# Patient Record
Sex: Female | Born: 1980
Health system: Southern US, Community
[De-identification: ages and names within clinical notes are randomized; demographics above are authoritative.]

## PROBLEM LIST (undated history)

## (undated) ENCOUNTER — Inpatient Hospital Stay (HOSPITAL_COMMUNITY): Payer: Self-pay

## (undated) DIAGNOSIS — T7840XA Allergy, unspecified, initial encounter: Secondary | ICD-10-CM

## (undated) DIAGNOSIS — Z972 Presence of dental prosthetic device (complete) (partial): Secondary | ICD-10-CM

## (undated) DIAGNOSIS — D649 Anemia, unspecified: Secondary | ICD-10-CM

## (undated) HISTORY — DX: Presence of dental prosthetic device (complete) (partial): Z97.2

## (undated) HISTORY — DX: Allergy, unspecified, initial encounter: T78.40XA

## (undated) HISTORY — PX: TOOTH EXTRACTION: SUR596

## (undated) HISTORY — DX: Anemia, unspecified: D64.9

## (undated) HISTORY — PX: OTHER SURGICAL HISTORY: SHX169

---

## 2004-04-07 ENCOUNTER — Emergency Department (HOSPITAL_COMMUNITY): Admission: EM | Admit: 2004-04-07 | Discharge: 2004-04-07 | Payer: Self-pay | Admitting: Family Medicine

## 2006-08-02 ENCOUNTER — Emergency Department (HOSPITAL_COMMUNITY): Admission: EM | Admit: 2006-08-02 | Discharge: 2006-08-03 | Payer: Self-pay | Admitting: *Deleted

## 2010-12-17 LAB — URINALYSIS, ROUTINE W REFLEX MICROSCOPIC
Bilirubin Urine: NEGATIVE
Hgb urine dipstick: NEGATIVE
Specific Gravity, Urine: 1.026
pH: 6

## 2010-12-17 LAB — COMPREHENSIVE METABOLIC PANEL
ALT: 17
AST: 15
CO2: 26
Calcium: 9.1
Chloride: 101
Creatinine, Ser: 0.66
GFR calc non Af Amer: >60
Glucose, Bld: 92
Sodium: 136
Total Bilirubin: 0.3

## 2010-12-17 LAB — CBC
Hemoglobin: 10.5 — ABNORMAL LOW
MCHC: 31.2
MCV: 70.1 — ABNORMAL LOW
RBC: 4.79
WBC: 11.8 — ABNORMAL HIGH

## 2010-12-17 LAB — DIFFERENTIAL
Basophils Absolute: 0
Basophils Relative: 0
Eosinophils Absolute: 0.1
Eosinophils Relative: 1
Lymphs Abs: 3.5 — ABNORMAL HIGH
Neutrophils Relative %: 65

## 2010-12-17 LAB — GC/CHLAMYDIA PROBE AMP, GENITAL
Chlamydia, DNA Probe: NEGATIVE
GC Probe Amp, Genital: NEGATIVE

## 2010-12-17 LAB — WET PREP, GENITAL: Trich, Wet Prep: NONE SEEN

## 2011-03-02 NOTE — L&D Delivery Note (Signed)
Delivery Note At 6:23 PM a viable female was delivered via Vaginal, Spontaneous Delivery (Presentation: ; Occiput Anterior).  APGAR: 9, 9; weight .   Placenta status: , .  Cord: 3 vessels with the following complications: None.  Cord pH: notb done  Anesthesia: Epidural  Episiotomy: None Lacerations: 2nd degree Suture Repair: 2.0 vicryl Est. Blood Loss (mL):   Mom to postpartum.  Baby to nursery-stable.  Odaliz Mcqueary A 11/09/2011, 6:37 PM

## 2011-06-05 ENCOUNTER — Inpatient Hospital Stay (HOSPITAL_COMMUNITY)
Admission: AD | Admit: 2011-06-05 | Discharge: 2011-06-05 | Disposition: A | Discharge status: Home / Self Care | Payer: Medicaid Other | Source: Ambulatory Visit | Attending: Obstetrics & Gynecology | Admitting: Obstetrics & Gynecology

## 2011-06-05 ENCOUNTER — Encounter (HOSPITAL_COMMUNITY): Payer: Self-pay | Admitting: *Deleted

## 2011-06-05 DIAGNOSIS — R05 Cough: Secondary | ICD-10-CM | POA: Insufficient documentation

## 2011-06-05 DIAGNOSIS — R109 Unspecified abdominal pain: Secondary | ICD-10-CM | POA: Insufficient documentation

## 2011-06-05 DIAGNOSIS — R059 Cough, unspecified: Secondary | ICD-10-CM | POA: Insufficient documentation

## 2011-06-05 DIAGNOSIS — O99891 Other specified diseases and conditions complicating pregnancy: Secondary | ICD-10-CM | POA: Insufficient documentation

## 2011-06-05 DIAGNOSIS — J069 Acute upper respiratory infection, unspecified: Secondary | ICD-10-CM

## 2011-06-05 DIAGNOSIS — N949 Unspecified condition associated with female genital organs and menstrual cycle: Secondary | ICD-10-CM

## 2011-06-05 LAB — URINALYSIS, ROUTINE W REFLEX MICROSCOPIC
Glucose, UA: NEGATIVE mg/dL
Hgb urine dipstick: NEGATIVE
Leukocytes, UA: NEGATIVE
Specific Gravity, Urine: 1.015 (ref 1.005–1.030)
pH: 8.5 — ABNORMAL HIGH (ref 5.0–8.0)

## 2011-06-05 MED ORDER — PROMETHAZINE-DM 6.25-15 MG/5ML PO SYRP: 5.0000 mL | ORAL_SOLUTION | Freq: Four times a day (QID) | ORAL | Status: AC | PRN

## 2011-06-05 MED ORDER — GUAIFENESIN ER 600 MG PO TB12: 1200.0000 mg | ORAL_TABLET | Freq: Two times a day (BID) | ORAL | Status: DC

## 2011-06-05 MED ORDER — LORATADINE 10 MG PO CAPS: 1.0000 | ORAL_CAPSULE | Freq: Every day | ORAL | Status: DC

## 2011-06-05 MED ORDER — AMOXICILLIN 500 MG PO CAPS: 500.0000 mg | ORAL_CAPSULE | Freq: Two times a day (BID) | ORAL | Status: AC

## 2011-06-05 NOTE — MAU Note (Signed)
Pt states, " I've had a cough with green sputum for nearly two weeks and I wheeze at night. I started having pain in my low abdomen last night after coughing severely. "

## 2011-06-05 NOTE — MAU Note (Signed)
Patient is here with c/o lower abdominal pain when you cough. She states that she has been coughing for 2 weeks. Have not started prenatal care because she is waiting on her insurance. Denies any vaginal bleeding or discharge

## 2011-06-05 NOTE — MAU Provider Note (Signed)
Chief Complaint:  Abdominal Pain and Cough    First Provider Initiated Contact with Patient 06/05/11 2300      Kelly Cox is  31 y.o. G1P0.  No LMP recorded. Patient is pregnant.. [redacted]w[redacted]d    She presents complaining of Abdominal Pain and Cough  Pt reports productive cough of yellow/green sputum x 2 weeks. Denies fever, chills, sore throat, HA, or ear pain. States sharp shooting lower abd pain x 2 days with coughing. Has not started [facility] yet, awaiting [facility] card. Plans to go to [facility]. Denies cramping, vaginal bleeding, discharge or UTI s/s  Obstetrical/Gynecological History: OB History    Grav Para Term Preterm Abortions TAB SAB Ect Mult Living   1               Past Medical History: History reviewed. No pertinent past medical history.  Past Surgical History: Past Surgical History  Procedure Date  . Right knee surgery     Family History: History reviewed. No pertinent family history.  Social History: History  Substance Use Topics  . Smoking status: Never Smoker   . Smokeless tobacco: Not on file  . Alcohol Use: No    Allergies: No Known Allergies  Prescriptions prior to admission  Medication Sig Dispense Refill  . Prenatal Vit-Fe Fumarate-FA (PRENATAL MULTIVITAMIN) TABS Take 2 tablets by mouth daily.        Review of Systems - Negative except what has been reviewed in the HPI  Physical Exam   Blood pressure 118/78, pulse 85, temperature 99.1 F (37.3 C), temperature source Oral, height 5' 4.25" (1.632 m), weight 221 lb (100.245 kg).  General: General appearance - alert, well appearing, and in no distress, oriented to person, place, and time and normal appearing weight Mental status - alert, oriented to person, place, and time, normal mood, behavior, speech, dress, motor activity, and thought processes, affect appropriate to mood Eyes - pupils equal and reactive, extraocular eye movements intact Neck - supple, no significant adenopathy Lymphatics - no  palpable lymphadenopathy, no hepatosplenomegaly Chest - clear to auscultation, no wheezes, rales or rhonchi, symmetric air entry Heart - normal rate, regular rhythm, normal S1, S2, no murmurs, rubs, clicks or gallops Abdomen - tenderness noted with palp of round ligament, no abd tenderness with palp Musculoskeletal - no joint tenderness, deformity or swelling Extremities - peripheral pulses normal, no pedal edema, no clubbing or cyanosis Focused Gynecological Exam: examination not indicated  Labs: Recent Results (from the past 24 hour(s))  URINALYSIS, ROUTINE W REFLEX MICROSCOPIC   Collection Time   06/05/11  9:43 PM      Component Value Range   Color, Urine YELLOW  YELLOW    APPearance HAZY (*) CLEAR    Specific Gravity, Urine 1.015  1.005 - 1.030    pH 8.5 (*) 5.0 - 8.0    Glucose, UA NEGATIVE  NEGATIVE (mg/dL)   Hgb urine dipstick NEGATIVE  NEGATIVE    Bilirubin Urine NEGATIVE  NEGATIVE    Ketones, ur NEGATIVE  NEGATIVE (mg/dL)   Protein, ur NEGATIVE  NEGATIVE (mg/dL)   Urobilinogen, UA 0.2  0.0 - 1.0 (mg/dL)   Nitrite NEGATIVE  NEGATIVE    Leukocytes, UA NEGATIVE  NEGATIVE     Assessment: 1. URI (upper respiratory infection)   2. Round ligament pain     Plan: Discharge home Rx ABX, mucinex, and cough med sent to [facility]. 06/05/2011,11:25 PM

## 2011-06-05 NOTE — Discharge Instructions (Signed)
Round Ligament Pain The round ligament is made up of muscle and fibrous tissue. It is attached to the uterus near the fallopian tube. The round ligament is located on both sides of the uterus and helps support the position of the uterus. It usually begins in the second trimester of pregnancy when the uterus comes out of the pelvis. The pain can come and go until the baby is delivered. Round ligament pain is not a serious problem and does not cause harm to the baby. CAUSE During pregnancy the uterus grows the most from the second trimester to delivery. As it grows, it stretches and slightly twists the round ligaments. When the uterus leans from one side to the other, the round ligament on the opposite side pulls and stretches. This can cause pain. SYMPTOMS  Pain can occur on one side or both sides. The pain is usually a short, sharp, and pinching-like. Sometimes it can be a dull, lingering and aching pain. The pain is located in the lower side of the abdomen or in the groin. The pain is internal and usually starts deep in the groin and moves up to the outside of the hip area. Pain can occur with:  Sudden change in position like getting out of bed or a chair.   Rolling over in bed.   Coughing or sneezing.   Walking too much.   Any type of physical activity.  DIAGNOSIS  Your caregiver will make sure there are no serious problems causing the pain. When nothing serious is found, the symptoms usually indicate that the pain is from the round ligament. TREATMENT   Sit down and relax when the pain starts.   Flex your knees up to your belly.   Lay on your side with a pillow under your belly (abdomen) and another one between your legs.   Sit in a hot bath for 15 to 20 minutes or until the pain goes away.  HOME CARE INSTRUCTIONS   Only take over-the-counter or prescriptions medicines for pain, discomfort or fever as directed by your caregiver.   Sit and stand slowly.   Avoid long walks if it  causes pain.   Stop or lessen your physical activities if it causes pain.  SEEK MEDICAL CARE IF:   The pain does not go away with any of your treatment.   You need stronger medication for the pain.   You develop back pain that you did not have before with the side pain.  SEEK IMMEDIATE MEDICAL CARE IF:   You develop a temperature of 102 F (38.9 C) or higher.   You develop uterine contractions.   You develop vaginal bleeding.   You develop nausea, vomiting or diarrhea.   You develop chills.   You have pain when you urinate.  Document Released: 11/25/2007 Document Revised: 02/04/2011 Document Reviewed: 11/25/2007 [facility] Patient Information 2012 [place], [place].Upper Respiratory Infection, Adult An upper respiratory infection (URI) is also known as the common cold. It is often caused by a type of germ (virus). Colds are easily spread (contagious). You can pass it to others by kissing, coughing, sneezing, or drinking out of the same glass. Usually, you get better in 1 or 2 weeks.  HOME CARE   Only take medicine as told by your doctor.   Use a warm mist humidifier or breathe in steam from a hot shower.   Drink enough water and fluids to keep your pee (urine) clear or pale yellow.   Get plenty of rest.  Return to work when your temperature is back to normal or as told by your doctor. You may use a face mask and wash your hands to stop your cold from spreading.  GET HELP RIGHT AWAY IF:   After the first few days, you feel you are getting worse.   You have questions about your medicine.   You have chills, shortness of breath, or brown or red spit (mucus).   You have yellow or brown snot (nasal discharge) or pain in the face, especially when you bend forward.   You have a fever, puffy (swollen) neck, pain when you swallow, or white spots in the back of your throat.   You have a bad headache, ear pain, sinus pain, or chest pain.   You have a high-pitched whistling sound  when you breathe in and out (wheezing).   You have a lasting cough or cough up blood.   You have sore muscles or a stiff neck.  MAKE SURE YOU:   Understand these instructions.   Will watch your condition.   Will get help right away if you are not doing well or get worse.  Document Released: 08/04/2007 Document Revised: 02/04/2011 Document Reviewed: 06/22/2010 [facility] Patient Information 2012 [place], [place].

## 2011-06-06 NOTE — MAU Provider Note (Signed)
Attestation of Attending Supervision of Advanced Practitioner: Evaluation and management procedures were performed by the [facility] Fellow/PA/CNM/NP under my supervision and collaboration. Chart reviewed, and agree with management and plan.  Jaynie Collins, M.D. 06/06/2011 8:24 AM

## 2011-07-02 ENCOUNTER — Other Ambulatory Visit: Payer: Self-pay | Admitting: Obstetrics & Gynecology

## 2011-07-02 ENCOUNTER — Other Ambulatory Visit: Payer: Self-pay

## 2011-07-02 DIAGNOSIS — Z3689 Encounter for other specified antenatal screening: Secondary | ICD-10-CM

## 2011-07-02 LAB — OB RESULTS CONSOLE HIV ANTIBODY (ROUTINE TESTING): HIV: NONREACTIVE

## 2011-07-02 LAB — OB RESULTS CONSOLE RPR: RPR: NONREACTIVE

## 2011-07-02 LAB — OB RESULTS CONSOLE GC/CHLAMYDIA: Chlamydia: NEGATIVE

## 2011-07-05 ENCOUNTER — Ambulatory Visit (HOSPITAL_COMMUNITY)
Admission: RE | Admit: 2011-07-05 | Discharge: 2011-07-05 | Disposition: A | Discharge status: Home / Self Care | Payer: Medicaid Other | Source: Ambulatory Visit | Attending: Obstetrics & Gynecology | Admitting: Obstetrics & Gynecology

## 2011-07-05 DIAGNOSIS — Z363 Encounter for antenatal screening for malformations: Secondary | ICD-10-CM | POA: Insufficient documentation

## 2011-07-05 DIAGNOSIS — Z1389 Encounter for screening for other disorder: Secondary | ICD-10-CM | POA: Insufficient documentation

## 2011-07-05 DIAGNOSIS — Z3689 Encounter for other specified antenatal screening: Secondary | ICD-10-CM

## 2011-07-05 DIAGNOSIS — O3410 Maternal care for benign tumor of corpus uteri, unspecified trimester: Secondary | ICD-10-CM | POA: Insufficient documentation

## 2011-07-05 DIAGNOSIS — O358XX0 Maternal care for other (suspected) fetal abnormality and damage, not applicable or unspecified: Secondary | ICD-10-CM | POA: Insufficient documentation

## 2011-07-08 ENCOUNTER — Other Ambulatory Visit: Payer: Self-pay | Admitting: Obstetrics & Gynecology

## 2011-07-30 ENCOUNTER — Other Ambulatory Visit: Payer: Self-pay | Admitting: Nurse Practitioner

## 2011-07-30 DIAGNOSIS — Z1389 Encounter for screening for other disorder: Secondary | ICD-10-CM

## 2011-08-04 ENCOUNTER — Encounter (HOSPITAL_COMMUNITY): Payer: Self-pay

## 2011-08-04 ENCOUNTER — Other Ambulatory Visit: Payer: Self-pay | Admitting: Nurse Practitioner

## 2011-08-04 ENCOUNTER — Ambulatory Visit (HOSPITAL_COMMUNITY)
Admission: RE | Admit: 2011-08-04 | Discharge: 2011-08-04 | Disposition: A | Discharge status: Home / Self Care | Payer: Medicaid Other | Source: Ambulatory Visit | Attending: Nurse Practitioner | Admitting: Nurse Practitioner

## 2011-08-04 VITALS — BP 107/69 | HR 89 | Wt 223.0 lb

## 2011-08-04 DIAGNOSIS — O3500X0 Maternal care for (suspected) central nervous system malformation or damage in fetus, unspecified, not applicable or unspecified: Secondary | ICD-10-CM | POA: Insufficient documentation

## 2011-08-04 DIAGNOSIS — Z1389 Encounter for screening for other disorder: Secondary | ICD-10-CM

## 2011-08-04 DIAGNOSIS — O3410 Maternal care for benign tumor of corpus uteri, unspecified trimester: Secondary | ICD-10-CM | POA: Insufficient documentation

## 2011-08-04 DIAGNOSIS — O350XX0 Maternal care for (suspected) central nervous system malformation in fetus, not applicable or unspecified: Secondary | ICD-10-CM | POA: Insufficient documentation

## 2011-08-04 NOTE — Progress Notes (Signed)
Patient seen today  for follow up ultrasound.  See full report in [facility]OB/[facility]N.  Alpha Gula, MD  Patient is seen due to unilateral ventriclomegaly.  The left lateral ventricle measures 1.04 to 1.1 cm (upper limits of normal 1cm).  No cranial calcifications noted.  The remainder of the anatomy is within normal limits.  Single IUP at 26 1/7 weeks Mild/ unilateral ventriculomegaly is noted (left side) Otherwise normal anatomic fetal survey Fetal growth is appropriate (63rd percentile) Normal amniotic fluid volume  Recommend follow up ultrasound in 4 weeks to reevaluate.

## 2011-08-05 NOTE — Progress Notes (Signed)
Genetic Counseling  High-Risk Gestation Note  Appointment Date:  08/04/2011 Referred By: Laverta Baltimore, NP Date of Birth:  1980/11/27    Pregnancy History: G1P0 Estimated Date of Delivery: 11/09/11 Estimated Gestational Age: [redacted]w[redacted]d Attending: Alpha Gula, MD  Ms. Kelly Cox was seen for genetic counseling given the previous ultrasound finding of ventriculomegaly.   Ultrasound today visualized [redacted]w[redacted]d gestation with mild, unilateral ventriculomegaly visualized. The left lateral ventricle was reported to measure from 1.04 cm to 1.1 cm. Remaining visualized fetal anatomy appeared normal. Complete ultrasound results reported separately.   We discussed that ventriculomegaly refers to an increased measurement of the lateral ventricles in the brain greater than 10 mm. Possible causes for mild ventriculomegaly include overproduction of cerebrospinal fluid, a blockage in a duct causing the fluid to accumulate, an intrauterine infection, or a variation of normal. We discussed that once ventriculomegaly is identified in pregnancy, follow-up ultrasounds are offered to assess for progression of ventriculomegaly. Following delivery, post-natal studies may be required in order to track progress and assess for any other differences in brain anatomy. It was discussed that surgical intervention may be required in some cases of ventriculomegaly. Most cases of isolated mild ventriculomegaly do not have an identified cause or associated condition, tend to regress with time, and have no resulting health or learning problems. However, it is possible that this finding may be seen in association with other ultrasound differences or a genetic condition. There is also a slightly increased chance of developmental delays. We discussed that the presence of ventriculomegaly is associated with an increased risk for aneuploidy, specifically Down syndrome.   We reviewed chromosomes, nondisjunction, and the features and prognosis  of Down syndrome. They were counseled regarding maternal age and the association with risk for chromosome conditions due to nondisjunction with aging of ova. We reviewed that Ms. Kelly Cox previously had Quad screening, which reduced the risk for fetal Down syndrome from her age-related risk to 1 in 16,900. The ultrasound finding of mild ventriculomegaly would increase the risk for Down syndrome from the screening risk to approximately 1 in 1,877, which is still less than the patient's a priori age related risk and less than the risk of complications associated with amniocentesis. She was counseled regarding their options of ultrasound and amniocentesis. It was discussed that not all birth defects can be identified with these procedures.  A risk of 1 in 200-300 was given for amniocentesis, the primary complication being spontaneous pregnancy loss or preterm delivery. We reviewed another available screening option, noninvasive prenatal testing (NIPT).  Specifically, we discussed that NIPT analyzes cell free fetal DNA found in the maternal circulation. This test is not diagnostic for chromosome conditions, but can provide information regarding the presence or absence of extra fetal DNA for chromosomes 13, 18, 21, X, and Y, and missing fetal DNA for chromosome X and Y (Turner syndrome). Thus, it would not identify or rule out all genetic conditions. The reported detection rate is greater than 99% for Trisomy 21, greater than 98% for Trisomy 18, and is approximately 80% (8 out of 10) for Trisomy 13. The false positive rate is reported to be less than 0.1% for any of these conditions. She indicated that she understood these risks and decided to have ultrasound only and declined amniocentesis and cell free fetal DNA testing. A follow-up ultrasound was planned in 4 weeks.  Ms. Kelly Cox was provided with written information regarding sickle cell anemia (SCA) including the carrier frequency and incidence in the  African-American  population, the availability of carrier testing and prenatal diagnosis if indicated.  In addition, we discussed that hemoglobinopathies are routinely screened for as part of the [place] newborn screening panel.  OB medical records indicate that the patient had a negative screen for sickle cell trait. Hemoglobin electrophoresis is available to screen for additional hemoglobin variants, if desired, and if not previously performed.   Both family histories were reviewed and found to be contributory for the patient's maternal half-sister born with a hole in the heart that closed on its own. She is otherwise healthy. Congenital heart defects occur in approximately 1% of pregnancies.  Congenital heart defects may occur due to multifactorial influences, chromosomal abnormalities, genetic syndromes or environmental exposures.  Isolated heart defects are generally multifactorial.  Given the reported family history and assuming multifactorial inheritance, the risk for a congenital heart defect in the current pregnancy (a third degree relative to the affected individual) does not appear to be increased above the general population risk. Without further information regarding the provided family history, an accurate genetic risk cannot be calculated. Further genetic counseling is warranted if more information is obtained.   Ms. Feeser denied exposure to environmental toxins or chemical agents. She denied the use of alcohol, tobacco or street drugs. She denied significant viral illnesses during the course of her pregnancy. Her medical and surgical histories were noncontributory.    I counseled Ms. Kelly Cox regarding the above risks and available options.  The approximate face-to-face time with the genetic counselor was 25 minutes.   Quinn Plowman, MS Certified Genetic Counselor  08/05/2011

## 2011-08-17 ENCOUNTER — Inpatient Hospital Stay (HOSPITAL_COMMUNITY)
Admission: AD | Admit: 2011-08-17 | Discharge: 2011-08-17 | Disposition: A | Discharge status: Home / Self Care | Payer: Medicaid Other | Source: Ambulatory Visit | Attending: Obstetrics & Gynecology | Admitting: Obstetrics & Gynecology

## 2011-08-17 DIAGNOSIS — Z2989 Encounter for other specified prophylactic measures: Secondary | ICD-10-CM | POA: Insufficient documentation

## 2011-08-17 DIAGNOSIS — Z298 Encounter for other specified prophylactic measures: Secondary | ICD-10-CM | POA: Insufficient documentation

## 2011-08-17 DIAGNOSIS — Z3480 Encounter for supervision of other normal pregnancy, unspecified trimester: Secondary | ICD-10-CM | POA: Insufficient documentation

## 2011-08-17 MED ORDER — RHO D IMMUNE GLOBULIN 1500 UNIT/2ML IJ SOLN
300.0000 ug | Freq: Once | INTRAMUSCULAR | Status: DC
  Administered 2011-08-17: 300 ug via INTRAMUSCULAR
  Filled 2011-08-17: qty 2

## 2011-08-17 NOTE — MAU Note (Signed)
Rhophylac information sheet given to the patient while waiting in the lobby. Explained the process of 1 1/2 hours required to complete this order. Patient verbalizes understanding of instructions.

## 2011-08-18 LAB — RH IG WORKUP (INCLUDES ABO/RH)
ABO/RH(D): A NEG
Fetal Screen: NEGATIVE
Gestational Age(Wks): 28

## 2011-09-01 ENCOUNTER — Ambulatory Visit (HOSPITAL_COMMUNITY)
Admission: RE | Admit: 2011-09-01 | Discharge: 2011-09-01 | Disposition: A | Discharge status: Home / Self Care | Payer: Medicaid Other | Source: Ambulatory Visit | Attending: Nurse Practitioner | Admitting: Nurse Practitioner

## 2011-09-01 VITALS — BP 108/66 | HR 92 | Wt 227.8 lb

## 2011-09-01 DIAGNOSIS — O350XX0 Maternal care for (suspected) central nervous system malformation in fetus, not applicable or unspecified: Secondary | ICD-10-CM | POA: Insufficient documentation

## 2011-09-01 DIAGNOSIS — Z1389 Encounter for screening for other disorder: Secondary | ICD-10-CM

## 2011-09-01 DIAGNOSIS — O358XX0 Maternal care for other (suspected) fetal abnormality and damage, not applicable or unspecified: Secondary | ICD-10-CM

## 2011-09-01 DIAGNOSIS — O3500X0 Maternal care for (suspected) central nervous system malformation or damage in fetus, unspecified, not applicable or unspecified: Secondary | ICD-10-CM | POA: Insufficient documentation

## 2011-09-01 DIAGNOSIS — O3410 Maternal care for benign tumor of corpus uteri, unspecified trimester: Secondary | ICD-10-CM | POA: Insufficient documentation

## 2011-09-01 NOTE — Progress Notes (Signed)
Patient seen today  for follow up ultrasound.  See full report in [facility]N.  Alpha Gula, MD  The patient returns for follow up due to isolated mild ventriculomegaly.  The lateral ventricles measure approximately 9mm (within normal limits)- improved since last visit  Single IUP at 30 1/7 weeks Mild ventriculomegaly appears to have resolved Otherwise normal anatomic fetal survey Fetal growth is appropriate (49th percentile) Normal amniotic fluid volume  Recommend follow up ultrasound in 4 weeks to reevaluate.  Would recommend cranial imaging studies of the newborn after delivery.

## 2011-09-29 ENCOUNTER — Ambulatory Visit (HOSPITAL_COMMUNITY)
Admission: RE | Admit: 2011-09-29 | Discharge: 2011-09-29 | Disposition: A | Discharge status: Home / Self Care | Payer: Medicaid Other | Source: Ambulatory Visit | Attending: Nurse Practitioner | Admitting: Nurse Practitioner

## 2011-09-29 VITALS — BP 112/74 | HR 100 | Wt 230.2 lb

## 2011-09-29 DIAGNOSIS — O3410 Maternal care for benign tumor of corpus uteri, unspecified trimester: Secondary | ICD-10-CM | POA: Insufficient documentation

## 2011-09-29 DIAGNOSIS — O3500X0 Maternal care for (suspected) central nervous system malformation or damage in fetus, unspecified, not applicable or unspecified: Secondary | ICD-10-CM | POA: Insufficient documentation

## 2011-09-29 DIAGNOSIS — O358XX0 Maternal care for other (suspected) fetal abnormality and damage, not applicable or unspecified: Secondary | ICD-10-CM

## 2011-09-29 DIAGNOSIS — O350XX0 Maternal care for (suspected) central nervous system malformation in fetus, not applicable or unspecified: Secondary | ICD-10-CM | POA: Insufficient documentation

## 2011-09-29 NOTE — Progress Notes (Signed)
Patient seen today  for follow up ultrasound.  See full report in [facility]OB/GYN.  Alpha Gula, MD  Single IUP at 34 1/7 weeks Mild/ borderline unilateral ventriculomegaly noted (12 mm) The cranial anatomy otherwise appears normal Interval growth is appropriate (40th %tile) Normal amniotic fluid volume   Recommend follow up ultrasounds as clinically indicated.  Recommend cranial imaging studies of the newborn after delivery.

## 2011-10-07 LAB — OB RESULTS CONSOLE GBS: GBS: NEGATIVE

## 2011-11-08 ENCOUNTER — Encounter (HOSPITAL_COMMUNITY): Payer: Self-pay | Admitting: *Deleted

## 2011-11-08 ENCOUNTER — Inpatient Hospital Stay (HOSPITAL_COMMUNITY)
Admission: AD | Admit: 2011-11-08 | Discharge: 2011-11-11 | DRG: 775 | Disposition: A | Discharge status: Home / Self Care | Payer: Medicaid Other | Source: Ambulatory Visit | Attending: Obstetrics | Admitting: Obstetrics

## 2011-11-08 LAB — CBC
HCT: 34 % — ABNORMAL LOW (ref 36.0–46.0)
Hemoglobin: 11 g/dL — ABNORMAL LOW (ref 12.0–15.0)
MCH: 24.1 pg — ABNORMAL LOW (ref 26.0–34.0)
MCHC: 32.4 g/dL (ref 30.0–36.0)
MCV: 74.4 fL — ABNORMAL LOW (ref 78.0–100.0)
Platelets: 272 10*3/uL (ref 150–400)
RBC: 4.57 MIL/uL (ref 3.87–5.11)
RDW: 15.6 % — ABNORMAL HIGH (ref 11.5–15.5)
WBC: 8.4 10*3/uL (ref 4.0–10.5)

## 2011-11-08 MED ORDER — FLEET ENEMA 7-19 GM/118ML RE ENEM: 1.0000 | ENEMA | RECTAL | Status: DC | PRN

## 2011-11-08 MED ORDER — LACTATED RINGERS IV SOLN: 500.0000 mL | INTRAVENOUS | Status: DC | PRN

## 2011-11-08 MED ORDER — ONDANSETRON HCL 4 MG/2ML IJ SOLN: 4.0000 mg | Freq: Four times a day (QID) | INTRAMUSCULAR | Status: DC | PRN

## 2011-11-08 MED ORDER — BUTORPHANOL TARTRATE 1 MG/ML IJ SOLN
1.0000 mg | INTRAMUSCULAR | Status: DC | PRN
Start: 2011-11-08 — End: 2011-11-09
  Administered 2011-11-09: 1 mg via INTRAVENOUS
  Filled 2011-11-08: qty 1

## 2011-11-08 MED ORDER — CITRIC ACID-SODIUM CITRATE 334-500 MG/5ML PO SOLN: 30.0000 mL | ORAL | Status: DC | PRN

## 2011-11-08 MED ORDER — OXYTOCIN 40 UNITS IN LACTATED RINGERS INFUSION - SIMPLE MED: 62.5000 mL/h | Freq: Once | INTRAVENOUS | Status: DC

## 2011-11-08 MED ORDER — OXYTOCIN 40 UNITS IN LACTATED RINGERS INFUSION - SIMPLE MED
1.0000 m[IU]/min | INTRAVENOUS | Status: DC
Start: 2011-11-08 — End: 2011-11-09
  Administered 2011-11-09: 1 m[IU]/min via INTRAVENOUS
  Filled 2011-11-08: qty 1000

## 2011-11-08 MED ORDER — OXYTOCIN BOLUS FROM INFUSION
500.0000 mL | Freq: Once | INTRAVENOUS | Status: AC
  Administered 2011-11-09: 500 mL via INTRAVENOUS
  Filled 2011-11-08: qty 500

## 2011-11-08 MED ORDER — ACETAMINOPHEN 325 MG PO TABS: 650.0000 mg | ORAL_TABLET | ORAL | Status: DC | PRN

## 2011-11-08 MED ORDER — TERBUTALINE SULFATE 1 MG/ML IJ SOLN: 0.2500 mg | Freq: Once | INTRAMUSCULAR | Status: AC | PRN

## 2011-11-08 MED ORDER — IBUPROFEN 600 MG PO TABS
600.0000 mg | ORAL_TABLET | Freq: Four times a day (QID) | ORAL | Status: DC | PRN
  Filled 2011-11-08: qty 1

## 2011-11-08 MED ORDER — LIDOCAINE HCL (PF) 1 % IJ SOLN: 30.0000 mL | INTRAMUSCULAR | Status: DC | PRN

## 2011-11-08 MED ORDER — LACTATED RINGERS IV SOLN
INTRAVENOUS | Status: DC
  Administered 2011-11-08 – 2011-11-09 (×4): via INTRAVENOUS

## 2011-11-08 MED ORDER — OXYCODONE-ACETAMINOPHEN 5-325 MG PO TABS: 1.0000 | ORAL_TABLET | ORAL | Status: DC | PRN

## 2011-11-08 NOTE — MAU Note (Signed)
Leaking clear fluid (~1700); now little pink spotting noted .  More fluid since . No pain.  No problems with preg.

## 2011-11-09 ENCOUNTER — Inpatient Hospital Stay (HOSPITAL_COMMUNITY): Payer: Medicaid Other | Admitting: Anesthesiology

## 2011-11-09 ENCOUNTER — Encounter (HOSPITAL_COMMUNITY): Payer: Self-pay | Admitting: Anesthesiology

## 2011-11-09 ENCOUNTER — Encounter (HOSPITAL_COMMUNITY): Payer: Self-pay | Admitting: *Deleted

## 2011-11-09 LAB — TYPE AND SCREEN

## 2011-11-09 MED ORDER — PHENYLEPHRINE 40 MCG/ML (10ML) SYRINGE FOR IV PUSH (FOR BLOOD PRESSURE SUPPORT)
80.0000 ug | PREFILLED_SYRINGE | INTRAVENOUS | Status: DC | PRN
  Filled 2011-11-09: qty 5

## 2011-11-09 MED ORDER — DIPHENHYDRAMINE HCL 50 MG/ML IJ SOLN: 12.5000 mg | INTRAMUSCULAR | Status: DC | PRN

## 2011-11-09 MED ORDER — LANOLIN HYDROUS EX OINT: TOPICAL_OINTMENT | CUTANEOUS | Status: DC | PRN

## 2011-11-09 MED ORDER — ZOLPIDEM TARTRATE 5 MG PO TABS: 5.0000 mg | ORAL_TABLET | Freq: Every evening | ORAL | Status: DC | PRN

## 2011-11-09 MED ORDER — SIMETHICONE 80 MG PO CHEW: 80.0000 mg | CHEWABLE_TABLET | ORAL | Status: DC | PRN

## 2011-11-09 MED ORDER — OXYCODONE-ACETAMINOPHEN 5-325 MG PO TABS: 1.0000 | ORAL_TABLET | ORAL | Status: DC | PRN

## 2011-11-09 MED ORDER — DIBUCAINE 1 % RE OINT: 1.0000 "application " | TOPICAL_OINTMENT | RECTAL | Status: DC | PRN

## 2011-11-09 MED ORDER — EPHEDRINE 5 MG/ML INJ: 10.0000 mg | INTRAVENOUS | Status: DC | PRN

## 2011-11-09 MED ORDER — TETANUS-DIPHTH-ACELL PERTUSSIS 5-2.5-18.5 LF-MCG/0.5 IM SUSP
0.5000 mL | Freq: Once | INTRAMUSCULAR | Status: AC
  Administered 2011-11-10: 0.5 mL via INTRAMUSCULAR
  Filled 2011-11-09: qty 0.5

## 2011-11-09 MED ORDER — SENNOSIDES-DOCUSATE SODIUM 8.6-50 MG PO TABS
2.0000 | ORAL_TABLET | Freq: Every day | ORAL | Status: DC
  Administered 2011-11-09 – 2011-11-10 (×2): 2 via ORAL

## 2011-11-09 MED ORDER — FERROUS SULFATE 325 (65 FE) MG PO TABS
325.0000 mg | ORAL_TABLET | Freq: Two times a day (BID) | ORAL | Status: DC
  Administered 2011-11-10 – 2011-11-11 (×3): 325 mg via ORAL
  Filled 2011-11-09 (×3): qty 1

## 2011-11-09 MED ORDER — FENTANYL 2.5 MCG/ML BUPIVACAINE 1/10 % EPIDURAL INFUSION (WH - ANES)
INTRAMUSCULAR | Status: DC | PRN
  Administered 2011-11-09: 14 mL/h via EPIDURAL

## 2011-11-09 MED ORDER — PHENYLEPHRINE 40 MCG/ML (10ML) SYRINGE FOR IV PUSH (FOR BLOOD PRESSURE SUPPORT): 80.0000 ug | PREFILLED_SYRINGE | INTRAVENOUS | Status: DC | PRN

## 2011-11-09 MED ORDER — FENTANYL 2.5 MCG/ML BUPIVACAINE 1/10 % EPIDURAL INFUSION (WH - ANES)
14.0000 mL/h | INTRAMUSCULAR | Status: DC
  Administered 2011-11-09 (×2): 14 mL/h via EPIDURAL
  Filled 2011-11-09 (×3): qty 60

## 2011-11-09 MED ORDER — EPHEDRINE 5 MG/ML INJ
10.0000 mg | INTRAVENOUS | Status: DC | PRN
  Filled 2011-11-09: qty 4

## 2011-11-09 MED ORDER — BENZOCAINE-MENTHOL 20-0.5 % EX AERO
1.0000 "application " | INHALATION_SPRAY | CUTANEOUS | Status: DC | PRN
  Administered 2011-11-11: 1 via TOPICAL
  Filled 2011-11-09: qty 56

## 2011-11-09 MED ORDER — ONDANSETRON HCL 4 MG PO TABS: 4.0000 mg | ORAL_TABLET | ORAL | Status: DC | PRN

## 2011-11-09 MED ORDER — LACTATED RINGERS IV SOLN
500.0000 mL | Freq: Once | INTRAVENOUS | Status: AC
  Administered 2011-11-09: 07:00:00 via INTRAVENOUS

## 2011-11-09 MED ORDER — IBUPROFEN 600 MG PO TABS
600.0000 mg | ORAL_TABLET | Freq: Four times a day (QID) | ORAL | Status: DC
  Administered 2011-11-09 – 2011-11-11 (×7): 600 mg via ORAL
  Filled 2011-11-09 (×6): qty 1

## 2011-11-09 MED ORDER — LIDOCAINE HCL (PF) 1 % IJ SOLN
INTRAMUSCULAR | Status: DC | PRN
  Administered 2011-11-09 (×2): 4 mL

## 2011-11-09 MED ORDER — WITCH HAZEL-GLYCERIN EX PADS: 1.0000 "application " | MEDICATED_PAD | CUTANEOUS | Status: DC | PRN

## 2011-11-09 MED ORDER — PRENATAL MULTIVITAMIN CH
1.0000 | ORAL_TABLET | Freq: Every day | ORAL | Status: DC
  Administered 2011-11-10 – 2011-11-11 (×2): 1 via ORAL
  Filled 2011-11-09 (×2): qty 1

## 2011-11-09 MED ORDER — ONDANSETRON HCL 4 MG/2ML IJ SOLN: 4.0000 mg | INTRAMUSCULAR | Status: DC | PRN

## 2011-11-09 MED ORDER — DIPHENHYDRAMINE HCL 25 MG PO CAPS: 25.0000 mg | ORAL_CAPSULE | Freq: Four times a day (QID) | ORAL | Status: DC | PRN

## 2011-11-09 NOTE — Progress Notes (Signed)
Dr. Gaynell Face called to check patient status.  Informed of SVE, IV pain med, FHR, and patient desire vitals.  No new orders received at this time.

## 2011-11-09 NOTE — Progress Notes (Signed)
Kelly Cox is a 31 y.o. G1P0000 at [redacted]w[redacted]d by LMP admitted for rupture of membranes  Subjective:   Objective: BP 133/94  Pulse 82  Temp 98.5 F (36.9 C) (Oral)  Resp 20  Ht 5\' 6"  (1.676 m)  Wt 107.049 kg (236 lb)  BMI 38.09 kg/m2  SpO2 99%  LMP 02/15/2011      FHT:  FHR: 150 bpm, variability: moderate,  accelerations:  Present,  decelerations:  Absent UC:   regular, every 3 minutes SVE:   Dilation: 6 Effacement (%): 90 Station: +1 Exam by:: k fields, rn  Labs: Lab Results  Component Value Date   WBC 8.4 11/08/2011   HGB 11.0* 11/08/2011   HCT 34.0* 11/08/2011   MCV 74.4* 11/08/2011   PLT 272 11/08/2011    Assessment / Plan: Augmentation of labor, progressing well  Labor: Progressing normally Preeclampsia:  n/a Fetal Wellbeing:  Category I Pain Control:  Epidural I/D:  n/a Anticipated MOD:  NSVD  Christine Morton A 11/09/2011, 3:28 PM

## 2011-11-09 NOTE — Anesthesia Preprocedure Evaluation (Signed)
Anesthesia Evaluation  Patient identified by MRN, date of birth, ID band Patient awake    Reviewed: Allergy & Precautions, H&P , Patient's Chart, lab work & pertinent test results  Airway Mallampati: III TM Distance: >3 FB Neck ROM: full    Dental No notable dental hx. (+) Teeth Intact   Pulmonary neg pulmonary ROS,  breath sounds clear to auscultation  Pulmonary exam normal       Cardiovascular negative cardio ROS  Rhythm:regular Rate:Normal     Neuro/Psych negative neurological ROS  negative psych ROS   GI/Hepatic negative GI ROS, Neg liver ROS,   Endo/Other  Morbid obesity  Renal/GU negative Renal ROS  negative genitourinary   Musculoskeletal   Abdominal Normal abdominal exam  (+)   Peds  Hematology negative hematology ROS (+)   Anesthesia Other Findings   Reproductive/Obstetrics (+) Pregnancy                           Anesthesia Physical Anesthesia Plan  ASA: III  Anesthesia Plan: Epidural   Post-op Pain Management:    Induction:   Airway Management Planned:   Additional Equipment:   Intra-op Plan:   Post-operative Plan:   Informed Consent: I have reviewed the patients History and Physical, chart, labs and discussed the procedure including the risks, benefits and alternatives for the proposed anesthesia with the patient or authorized representative who has indicated his/her understanding and acceptance.     Plan Discussed with: Anesthesiologist, CRNA and Surgeon  Anesthesia Plan Comments:         Anesthesia Quick Evaluation

## 2011-11-09 NOTE — H&P (Signed)
This is Dr. Francoise Ceo dictating the history and physical on  Kelly Cox she is a 31 year old gravida 1 at 74 weeks her EDC is 11/09/2011 she was admitted because her membranes ruptured spontaneously at 4:27 PM yesterday fluid clear and she is now contracting irregularly she is on low-dose Pitocin and she is a negative GBS cervix 2 cm 60% vertex -2 station Past medical history negative Past surgical history negative Social history noncontributory Family history negative System review noncontributory Physical exam revealed a well-developed female in and in labor HEENT negative Lungs clear to P&A Heart regular rhythm no murmurs no gallops breasts negative Abdomen term estimated fetal weight 7 pounds Pelvic as described above

## 2011-11-09 NOTE — Anesthesia Procedure Notes (Signed)
Epidural Patient location during procedure: OB Start time: 11/09/2011 8:00 AM  Staffing Anesthesiologist: Kenith Trickel A. Performed by: anesthesiologist   Preanesthetic Checklist Completed: patient identified, site marked, surgical consent, pre-op evaluation, timeout performed, IV checked, risks and benefits discussed and monitors and equipment checked  Epidural Patient position: sitting Prep: site prepped and draped and DuraPrep Patient monitoring: continuous pulse ox and blood pressure Approach: midline Injection technique: LOR air  Needle:  Needle type: Tuohy  Needle gauge: 17 G Needle length: 9 cm and 9 Needle insertion depth: 8 cm Catheter type: closed end flexible Catheter size: 19 Gauge Catheter at skin depth: 13 cm Test dose: negative and Other  Assessment Events: blood not aspirated, injection not painful, no injection resistance, negative IV test and no paresthesia  Additional Notes Patient identified. Risks and benefits discussed including failed block, incomplete  Pain control, post dural puncture headache, nerve damage, paralysis, blood pressure Changes, nausea, vomiting, reactions to medications-both toxic and allergic and post Partum back pain. All questions were answered. Patient expressed understanding and wished to proceed. Sterile technique was used throughout procedure. Epidural site was Dressed with sterile barrier dressing. No paresthesias, signs of intravascular injection Or signs of intrathecal spread were encountered.  Patient was more comfortable after the epidural was dosed. Please see RN's note for documentation of vital signs and FHR which are stable.

## 2011-11-10 LAB — CBC
MCV: 74 fL — ABNORMAL LOW (ref 78.0–100.0)
Platelets: 247 10*3/uL (ref 150–400)
RDW: 15.8 % — ABNORMAL HIGH (ref 11.5–15.5)
WBC: 12.2 10*3/uL — ABNORMAL HIGH (ref 4.0–10.5)

## 2011-11-10 MED ORDER — RHO D IMMUNE GLOBULIN 1500 UNIT/2ML IJ SOLN
300.0000 ug | Freq: Once | INTRAMUSCULAR | Status: AC
  Administered 2011-11-10: 300 ug via INTRAMUSCULAR
  Filled 2011-11-10: qty 2

## 2011-11-10 NOTE — Progress Notes (Signed)
Ur chart review completed.  

## 2011-11-10 NOTE — Progress Notes (Signed)
Post Partum Day 1 Subjective: no complaints  Objective: Blood pressure 107/69, pulse 80, temperature 98.2 F (36.8 C), temperature source Oral, resp. rate 18, height 5\' 6"  (1.676 m), weight 107.049 kg (236 lb), last menstrual period 02/15/2011, SpO2 96.00%, unknown if currently breastfeeding.  Physical Exam:  General: alert and no distress Lochia: appropriate Uterine Fundus: firm Incision: healing well DVT Evaluation: No evidence of DVT seen on physical exam.   Basename 11/10/11 0550 11/08/11 2014  HGB 9.2* 11.0*  HCT 28.8* 34.0*    Assessment/Plan: Plan for discharge tomorrow   LOS: 2 days   Sadao Weyer A 11/10/2011, 12:43 PM

## 2011-11-10 NOTE — Anesthesia Postprocedure Evaluation (Signed)
  Anesthesia Post-op Note  Patient: Kelly Cox  Procedure(s) Performed: * No procedures listed *  Patient Location: Mother/Baby  Anesthesia Type: Epidural  Level of Consciousness: awake, alert  and oriented  Airway and Oxygen Therapy: Patient Spontanous Breathing  Post-op Pain: none  Post-op Assessment: Post-op Vital signs reviewed, Patient's Cardiovascular Status Stable, No headache and No backache, residual numbness in right thigh which patient reports is resolving.  Post-op Vital Signs: Reviewed and stable  Complications: No apparent anesthesia complications

## 2011-11-11 LAB — RH IG WORKUP (INCLUDES ABO/RH)
Fetal Screen: NEGATIVE
Gestational Age(Wks): 40

## 2011-11-11 MED ORDER — IBUPROFEN 600 MG PO TABS: 600.0000 mg | ORAL_TABLET | Freq: Four times a day (QID) | ORAL | Status: DC | PRN

## 2011-11-11 MED ORDER — OXYCODONE-ACETAMINOPHEN 5-325 MG PO TABS: 1.0000 | ORAL_TABLET | ORAL | Status: AC | PRN

## 2011-11-11 MED ORDER — PRENATAL MULTIVITAMIN CH: 1.0000 | ORAL_TABLET | Freq: Every day | ORAL | Status: DC

## 2011-11-11 NOTE — Progress Notes (Signed)
Post Partum Day 2 Subjective: no complaints  Objective: Blood pressure 105/69, pulse 84, temperature 98.1 F (36.7 C), temperature source Oral, resp. rate 16, height 5\' 6"  (1.676 m), weight 107.049 kg (236 lb), last menstrual period 02/15/2011, SpO2 97.00%, unknown if currently breastfeeding.  Physical Exam:  General: alert and no distress Lochia: appropriate Uterine Fundus: firm Incision: healing well DVT Evaluation: No evidence of DVT seen on physical exam.   Basename 11/10/11 0550 11/08/11 2014  HGB 9.2* 11.0*  HCT 28.8* 34.0*    Assessment/Plan: Discharge home   LOS: 3 days   Elika Godar A 11/11/2011, 8:48 AM

## 2011-11-11 NOTE — Discharge Summary (Signed)
Obstetric Discharge Summary Reason for Admission: onset of labor Prenatal Procedures: ultrasound Intrapartum Procedures: spontaneous vaginal delivery Postpartum Procedures: none Complications-Operative and Postpartum: none Hemoglobin  Date Value Range Status  11/10/2011 9.2* 12.0 - 15.0 g/dL Final     HCT  Date Value Range Status  11/10/2011 28.8* 36.0 - 46.0 % Final    Physical Exam:  General: alert and no distress Lochia: appropriate Uterine Fundus: firm Incision: healing well DVT Evaluation: No evidence of DVT seen on physical exam.  Discharge Diagnoses: Term Pregnancy-delivered  Discharge Information: Date: 11/11/2011 Activity: pelvic rest Diet: routine Medications: PNV, Ibuprofen, Colace and Percocet Condition: stable Instructions: refer to practice specific booklet Discharge to: home Follow-up Information    Follow up with HARPER,CHARLES A, MD. Schedule an appointment as soon as possible for a visit in 6 weeks.   Contact information:   [address] [place] [address] [phone]          Newborn Data: Live born female  Birth Weight: 6 lb 13.9 oz (3116 g) APGAR: 9, 9  Home with mother.  HARPER,CHARLES A 11/11/2011, 8:57 AM

## 2013-10-16 ENCOUNTER — Ambulatory Visit (INDEPENDENT_AMBULATORY_CARE_PROVIDER_SITE_OTHER): Payer: BC Managed Care – PPO | Admitting: Medical

## 2013-10-16 ENCOUNTER — Encounter: Payer: Self-pay | Admitting: Medical

## 2013-10-16 VITALS — BP 118/78 | HR 80 | Temp 98.3°F | Resp 16 | Ht 64.0 in | Wt 252.0 lb

## 2013-10-16 DIAGNOSIS — R0982 Postnasal drip: Secondary | ICD-10-CM

## 2013-10-16 DIAGNOSIS — E669 Obesity, unspecified: Secondary | ICD-10-CM

## 2013-10-16 DIAGNOSIS — Z23 Encounter for immunization: Secondary | ICD-10-CM

## 2013-10-16 DIAGNOSIS — R202 Paresthesia of skin: Secondary | ICD-10-CM

## 2013-10-16 DIAGNOSIS — J329 Chronic sinusitis, unspecified: Secondary | ICD-10-CM

## 2013-10-16 DIAGNOSIS — Z0000 Encounter for general adult medical examination without abnormal findings: Secondary | ICD-10-CM

## 2013-10-16 DIAGNOSIS — R209 Unspecified disturbances of skin sensation: Secondary | ICD-10-CM

## 2013-10-16 LAB — POCT URINALYSIS DIPSTICK
Bilirubin, UA: NEGATIVE
Glucose, UA: NEGATIVE
KETONES UA: NEGATIVE
LEUKOCYTES UA: NEGATIVE
Nitrite, UA: NEGATIVE
PH UA: 5
PROTEIN UA: NEGATIVE
Spec Grav, UA: 1.02
Urobilinogen, UA: NEGATIVE

## 2013-10-16 MED ORDER — BENZONATATE 200 MG PO CAPS: 200.0000 mg | ORAL_CAPSULE | Freq: Three times a day (TID) | ORAL | Status: DC | PRN

## 2013-10-16 NOTE — Patient Instructions (Signed)
Thank you for giving me the opportunity to serve you today.    Your diagnosis today includes: Encounter Diagnoses  Name Primary?  . Routine general medical examination at a health care facility Yes  . Need for prophylactic vaccination and inoculation against influenza   . Post-nasal drainage   . Paresthesia   . Obesity, unspecified      Specific recommendations today include:  See a dentist yearly for routine preventative care  Exercise regularly, eat healthy, work on losing some weight  Begin nighttime Benadryl or Zyrtec for postnasal drainage  You may use Tessalon Perles for cough up to 3 times day  We updated your flu shot today  Begin nighttime carpal tunnel splints over-the-counter for carpal tunnel symptoms  You may use ibuprofen for carpal tunnel symptoms as well  Return fasting for labs within a week at today's visit  Return pending f/u for labs.    I have included other useful information below for your review.   Preventative Care for Adults - Female      MAINTAIN REGULAR HEALTH EXAMS:  A routine yearly physical is a good way to check in with your primary care provider about your health and preventive screening. It is also an opportunity to share updates about your health and any concerns you have, and receive a thorough all-over exam.   Most health insurance companies pay for at least some preventative services.  Check with your health plan for specific coverages.  WHAT PREVENTATIVE SERVICES DO WOMEN NEED?  Adult women should have their weight and blood pressure checked regularly.   Women age 39 and older should have their cholesterol levels checked regularly.  Women should be screened for cervical cancer with a Pap smear and pelvic exam beginning at either age 54, or 3 years after they become sexually activity.    Breast cancer screening generally begins at age 59 with a mammogram and breast exam by your primary care provider.    Beginning at age  51 and continuing to age 57, women should be screened for colorectal cancer.  Certain people may need continued testing until age 25.  Updating vaccinations is part of preventative care.  Vaccinations help protect against diseases such as the flu.  Osteoporosis is a disease in which the bones lose minerals and strength as we age. Women ages 43 and over should discuss this with their caregivers, as should women after menopause who have other risk factors.  Lab tests are generally done as part of preventative care to screen for anemia and blood disorders, to screen for problems with the kidneys and liver, to screen for bladder problems, to check blood sugar, and to check your cholesterol level.  Preventative services generally include counseling about diet, exercise, avoiding tobacco, drugs, excessive alcohol consumption, and sexually transmitted infections.    GENERAL RECOMMENDATIONS FOR GOOD HEALTH:  Healthy diet:  Eat a variety of foods, including fruit, vegetables, animal or vegetable protein, such as meat, fish, chicken, and eggs, or beans, lentils, tofu, and grains, such as rice.  Drink plenty of water daily.  Decrease saturated fat in the diet, avoid lots of red meat, processed foods, sweets, fast foods, and fried foods.  Exercise:  Aerobic exercise helps maintain good heart health. At least 30-40 minutes of moderate-intensity exercise is recommended. For example, a brisk walk that increases your heart rate and breathing. This should be done on most days of the week.   Find a type of exercise or a variety of exercises  that you enjoy so that it becomes a part of your daily life.  Examples are running, walking, swimming, water aerobics, and biking.  For motivation and support, explore group exercise such as aerobic class, spin class, Zumba, Yoga,or  martial arts, etc.    Set exercise goals for yourself, such as a certain weight goal, walk or run in a race such as a 5k walk/run.  Speak to  your primary care provider about exercise goals.  Disease prevention:  If you smoke or chew tobacco, find out from your caregiver how to quit. It can literally save your life, no matter how long you have been a tobacco user. If you do not use tobacco, never begin.   Maintain a healthy diet and normal weight. Increased weight leads to problems with blood pressure and diabetes.   The Body Mass Index or BMI is a way of measuring how much of your body is fat. Having a BMI above 27 increases the risk of heart disease, diabetes, hypertension, stroke and other problems related to obesity. Your caregiver can help determine your BMI and based on it develop an exercise and dietary program to help you achieve or maintain this important measurement at a healthful level.  High blood pressure causes heart and blood vessel problems.  Persistent high blood pressure should be treated with medicine if weight loss and exercise do not work.   Fat and cholesterol leaves deposits in your arteries that can block them. This causes heart disease and vessel disease elsewhere in your body.  If your cholesterol is found to be high, or if you have heart disease or certain other medical conditions, then you may need to have your cholesterol monitored frequently and be treated with medication.   Ask if you should have a cardiac stress test if your history suggests this. A stress test is a test done on a treadmill that looks for heart disease. This test can find disease prior to there being a problem.  Menopause can be associated with physical symptoms and risks. Hormone replacement therapy is available to decrease these. You should talk to your caregiver about whether starting or continuing to take hormones is right for you.   Osteoporosis is a disease in which the bones lose minerals and strength as we age. This can result in serious bone fractures. Risk of osteoporosis can be identified using a bone density scan. Women ages 77  and over should discuss this with their caregivers, as should women after menopause who have other risk factors. Ask your caregiver whether you should be taking a calcium supplement and Vitamin D, to reduce the rate of osteoporosis.   Avoid drinking alcohol in excess (more than two drinks per day).  Avoid use of street drugs. Do not share needles with anyone. Ask for professional help if you need assistance or instructions on stopping the use of alcohol, cigarettes, and/or drugs.  Brush your teeth twice a day with fluoride toothpaste, and floss once a day. Good oral hygiene prevents tooth decay and gum disease. The problems can be painful, unattractive, and can cause other health problems. Visit your dentist for a routine oral and dental check up and preventive care every 6-12 months.   Look at your skin regularly.  Use a mirror to look at your back. Notify your caregivers of changes in moles, especially if there are changes in shapes, colors, a size larger than a pencil eraser, an irregular border, or development of new moles.  Safety:  Use  seatbelts 100% of the time, whether driving or as a passenger.  Use safety devices such as hearing protection if you work in environments with loud noise or significant background noise.  Use safety glasses when doing any work that could send debris in to the eyes.  Use a helmet if you ride a bike or motorcycle.  Use appropriate safety gear for contact sports.  Talk to your caregiver about gun safety.  Use sunscreen with a SPF (or skin protection factor) of 15 or greater.  Lighter skinned people are at a greater risk of skin cancer. Don't forget to also wear sunglasses in order to protect your eyes from too much damaging sunlight. Damaging sunlight can accelerate cataract formation.   Practice safe sex. Use condoms. Condoms are used for birth control and to help reduce the spread of sexually transmitted infections (or STIs).  Some of the STIs are gonorrhea (the  clap), chlamydia, syphilis, trichomonas, herpes, HPV (human papilloma virus) and HIV (human immunodeficiency virus) which causes AIDS. The herpes, HIV and HPV are viral illnesses that have no cure. These can result in disability, cancer and death.   Keep carbon monoxide and smoke detectors in your home functioning at all times. Change the batteries every 6 months or use a model that plugs into the wall.   Vaccinations:  Stay up to date with your tetanus shots and other required immunizations. You should have a booster for tetanus every 10 years. Be sure to get your flu shot every year, since 5%-20% of the [place] population comes down with the flu. The flu vaccine changes each year, so being vaccinated once is not enough. Get your shot in the fall, before the flu season peaks.   Other vaccines to consider:  Human Papilloma Virus or HPV causes cancer of the cervix, and other infections that can be transmitted from person to person. There is a vaccine for HPV, and females should get immunized between the ages of 43 and 5. It requires a series of 3 shots.   Pneumococcal vaccine to protect against certain types of pneumonia.  This is normally recommended for adults age 41 or older.  However, adults younger than 35 years old with certain underlying conditions such as diabetes, heart or lung disease should also receive the vaccine.  Shingles vaccine to protect against Varicella Zoster if you are older than age 38, or younger than 35 years old with certain underlying illness.  Hepatitis A vaccine to protect against a form of infection of the liver by a virus acquired from food.  Hepatitis B vaccine to protect against a form of infection of the liver by a virus acquired from blood or body fluids, particularly if you work in health care.  If you plan to travel internationally, check with your local health department for specific vaccination recommendations.  Cancer Screening:  Breast cancer screening is  essential to preventive care for women. All women age 60 and older should perform a breast self-exam every month. At age 67 and older, women should have their caregiver complete a breast exam each year. Women at ages 31 and older should have a mammogram (x-ray film) of the breasts. Your caregiver can discuss how often you need mammograms.    Cervical cancer screening includes taking a Pap smear (sample of cells examined under a microscope) from the cervix (end of the uterus). It also includes testing for HPV (Human Papilloma Virus, which can cause cervical cancer). Screening and a pelvic exam should begin at  age 38, or 3 years after a woman becomes sexually active. Screening should occur every year, with a Pap smear but no HPV testing, up to age 59. After age 35, you should have a Pap smear every 3 years with HPV testing, if no HPV was found previously.   Most routine colon cancer screening begins at the age of 80. On a yearly basis, doctors may provide special easy to use take-home tests to check for hidden blood in the stool. Sigmoidoscopy or colonoscopy can detect the earliest forms of colon cancer and is life saving. These tests use a small camera at the end of a tube to directly examine the colon. Speak to your caregiver about this at age 14, when routine screening begins (and is repeated every 5 years unless early forms of pre-cancerous polyps or small growths are found).

## 2013-10-16 NOTE — Progress Notes (Signed)
Subjective:   HPI  Kelly Cox is a 33 y.o. female who presents for a complete physical.  New patient today.   Preventative care:2013 Last ophthalmology visit: 2 years ago [address] Last dental visit: 1 year ago, Dr Tye Savoy Last colonoscopy:no Last mammogram:no Last gynecological exam:2013 Last EKG:no Last labs:no  Prior vaccinations: TD or Tdap: unsure Influenza:no Pneumococcal:no Shingles/Zostavax:no  Advanced directive:no Health care power of attorney:no Living will:no  Concerns: She has some skin lesion she wants me to look at.  Donated blood in her teenage years, ever since then has been a scar of her left antecubital region. Has 2 other bumps recently popped up on her arm and foot  Not sexually active.  No current sexual partner.  Last STD testing normal, last pap (s) x 3 normal, last one 2013.  periods regular.  Reviewed their medical, surgical, family, social, medication, and allergy history and updated chart as appropriate.   Review of Systems Constitutional: -fever, -chills, -sweats, -unexpected weight change, -decreased appetite, +fatigue Allergy: -sneezing, -itching, -congestion Dermatology: +changing moles, --rash, -lumps ENT: -runny nose, -ear pain, -sore throat, -hoarseness, -sinus pain, -teeth pain, - ringing in ears, -hearing loss, -nosebleeds Cardiology: +chest pain, -palpitations, -swelling, -difficulty breathing when lying flat, -waking up short of breath Respiratory: -cough, -shortness of breath, -difficulty breathing with exercise or exertion, -wheezing, -coughing up blood Gastroenterology: -abdominal pain, -nausea, -vomiting, -diarrhea, -constipation, -blood in stool, -changes in bowel movement, -difficulty swallowing or eating Hematology: -bleeding, -bruising  Musculoskeletal: -joint aches, -muscle aches, -joint swelling, +back pain, -neck pain, -cramping, -changes in gait Ophthalmology: denies vision changes, eye redness, itching,  discharge Urology: -burning with urination, -difficulty urinating, -blood in urine, -urinary frequency, -urgency, -incontinence Neurology: -headache, -weakness +tingling, -numbness, -memory loss, -falls, -dizziness Psychology: -depressed mood, -agitation, -sleep problems  Past Medical History  Diagnosis Date  . Allergy   . Presence of partial dental prosthetic device     Past Surgical History  Procedure Laterality Date  . Right knee surgery      arthoscopic surgery for meniscal tear, Dr. Marlou Sa  . Tooth extraction      History   Social History  . Marital Status: Single    Spouse Name: N/A    Number of Children: N/A  . Years of Education: N/A   Occupational History  . Not on file.   Social History Main Topics  . Smoking status: Never Smoker   . Smokeless tobacco: Never Used  . Alcohol Use: No  . Drug Use: No  . Sexual Activity: Not Currently   Other Topics Concern  . Not on file   Social History Narrative   Single, has 2yo son, works at [employer] in [employer], exercise with walking, involved in [employer].    Family History  Problem Relation Age of Onset  . Diabetes Mother   . Other Father     unknown  . Anxiety disorder Sister   . Diabetes Sister   . Hypertension Sister   . Cancer Maternal Aunt     breast  . Cancer Maternal Grandmother     pancreatic  . Heart disease Neg Hx   . Stroke Neg Hx     Current outpatient prescriptions:ibuprofen (ADVIL,MOTRIN) 600 MG tablet, Take 1 tablet (600 mg total) by mouth every 6 (six) hours as needed (pain scale < 4)., Disp: 30 tablet, Rfl: 5;  Prenatal Vit-Fe Fumarate-FA (PRENATAL MULTIVITAMIN) TABS, Take 1 tablet by mouth daily., Disp: 30 tablet, Rfl: 11  No Known Allergies  Objective:   Physical Exam  BP 118/78  Pulse 80  Temp(Src) 98.3 F (36.8 C) (Oral)  Resp 16  Ht 5\' 4"  (1.626 m)  Wt 252 lb (114.306 kg)  BMI 43.23 kg/m2  LMP 10/15/2013   General appearance: alert, no distress,  WD/WN, obese AA female Skin: right antecubital region with linear 2.5cm brown raised scar c/w keloid, small 2 mm maculopapular lesion of right forearm, similar lesion on dorsal left foot, both benign appearing, no other worrisome lesions HEENT: normocephalic, conjunctiva/corneas normal, sclerae anicteric, PERRLA, EOMi, nares patent, no discharge or erythema, pharynx normal Oral cavity: MMM, tongue normal, teeth in good repair Neck: supple, no lymphadenopathy, no thyromegaly, no masses, normal ROM, no bruits Chest: non tender, normal shape and expansion Heart: RRR, normal S1, S2, no murmurs Lungs: CTA bilaterally, no wheezes, rhonchi, or rales Abdomen: +bs, soft, non tender, non distended, no masses, no hepatomegaly, no splenomegaly, no bruits Back: non tender, normal ROM, no scoliosis Musculoskeletal: upper extremities non tender, no obvious deformity, normal ROM throughout, lower extremities non tender, no obvious deformity, normal ROM throughout Extremities: no edema, no cyanosis, no clubbing Pulses: 2+ symmetric, upper and lower extremities, normal cap refill Neurological: alert, oriented x 3, CN2-12 intact, strength normal upper extremities and lower extremities, sensation normal throughout, DTRs 2+ throughout, no cerebellar signs, gait normal Psychiatric: normal affect, behavior normal, pleasant  Breast/gyn/rectal - declined   Assessment and Plan :    Encounter Diagnoses  Name Primary?  . Routine general medical examination at a health care facility Yes  . Need for prophylactic vaccination and inoculation against influenza   . Post-nasal drainage   . Paresthesia   . Obesity, unspecified     Physical exam - discussed healthy lifestyle, diet, exercise, preventative care, vaccinations, and addressed their concerns.  Handout given.  Counseled on the influenza virus vaccine.  Vaccine information sheet given.  Influenza vaccine given after consent obtained.  Pap due 2016.  Declines  breast/pelvic exam.  May consider going back to gyn.  Specific recommendations today include:  See a dentist yearly for routine preventative care  Exercise regularly, eat healthy, work on losing some weight  Begin nighttime Benadryl or Zyrtec for postnasal drainage  You may use Tessalon Perles for cough up to 3 times day  We updated your flu shot today  Begin nighttime carpal tunnel splints over-the-counter for carpal tunnel symptoms  You may use ibuprofen for carpal tunnel symptoms as well  Return fasting for labs within a week at today's visit  Follow-up pending lab f/u

## 2013-10-19 ENCOUNTER — Other Ambulatory Visit: Payer: BC Managed Care – PPO

## 2013-10-19 DIAGNOSIS — R0982 Postnasal drip: Secondary | ICD-10-CM

## 2013-10-19 DIAGNOSIS — R202 Paresthesia of skin: Secondary | ICD-10-CM

## 2013-10-19 DIAGNOSIS — Z23 Encounter for immunization: Secondary | ICD-10-CM

## 2013-10-19 DIAGNOSIS — E669 Obesity, unspecified: Secondary | ICD-10-CM

## 2013-10-19 DIAGNOSIS — Z0000 Encounter for general adult medical examination without abnormal findings: Secondary | ICD-10-CM

## 2013-10-19 LAB — LIPID PANEL
CHOL/HDL RATIO: 5 ratio
Cholesterol: 174 mg/dL (ref 0–200)
HDL: 35 mg/dL — AB (ref >39)
LDL CALC: 127 mg/dL — AB (ref 0–99)
TRIGLYCERIDES: 62 mg/dL (ref <150)
VLDL: 12 mg/dL (ref 0–40)

## 2013-10-19 LAB — COMPREHENSIVE METABOLIC PANEL
ALK PHOS: 93 U/L (ref 39–117)
ALT: 12 U/L (ref 0–35)
AST: 11 U/L (ref 0–37)
Albumin: 3.7 g/dL (ref 3.5–5.2)
BILIRUBIN TOTAL: 0.2 mg/dL (ref 0.2–1.2)
BUN: 13 mg/dL (ref 6–23)
CO2: 26 mEq/L (ref 19–32)
CREATININE: 0.62 mg/dL (ref 0.50–1.10)
Calcium: 8.8 mg/dL (ref 8.4–10.5)
Chloride: 106 mEq/L (ref 96–112)
GLUCOSE: 97 mg/dL (ref 70–99)
POTASSIUM: 4.4 meq/L (ref 3.5–5.3)
Sodium: 138 mEq/L (ref 135–145)
Total Protein: 6.8 g/dL (ref 6.0–8.3)

## 2013-10-19 LAB — CBC WITH DIFFERENTIAL/PLATELET
BASOS ABS: 0 10*3/uL (ref 0.0–0.1)
BASOS PCT: 0 % (ref 0–1)
EOS ABS: 0.1 10*3/uL (ref 0.0–0.7)
EOS PCT: 1 % (ref 0–5)
HEMATOCRIT: 32.6 % — AB (ref 36.0–46.0)
Hemoglobin: 10.4 g/dL — ABNORMAL LOW (ref 12.0–15.0)
Lymphocytes Relative: 48 % — ABNORMAL HIGH (ref 12–46)
Lymphs Abs: 3.6 10*3/uL (ref 0.7–4.0)
MCH: 21.8 pg — AB (ref 26.0–34.0)
MCHC: 31.9 g/dL (ref 30.0–36.0)
MCV: 68.3 fL — ABNORMAL LOW (ref 78.0–100.0)
MONO ABS: 0.5 10*3/uL (ref 0.1–1.0)
Monocytes Relative: 6 % (ref 3–12)
NEUTROS ABS: 3.4 10*3/uL (ref 1.7–7.7)
Neutrophils Relative %: 45 % (ref 43–77)
Platelets: 327 10*3/uL (ref 150–400)
RBC: 4.77 MIL/uL (ref 3.87–5.11)
RDW: 17.9 % — AB (ref 11.5–15.5)
WBC: 7.5 10*3/uL (ref 4.0–10.5)

## 2013-10-20 LAB — HEMOGLOBIN A1C
Hgb A1c MFr Bld: 6 % — ABNORMAL HIGH (ref <5.7)
Mean Plasma Glucose: 126 mg/dL — ABNORMAL HIGH (ref <117)

## 2013-10-20 LAB — TSH: TSH: 1.035 u[IU]/mL (ref 0.350–4.500)

## 2013-10-24 ENCOUNTER — Other Ambulatory Visit: Payer: Self-pay | Admitting: Family Medicine

## 2013-10-24 DIAGNOSIS — Z01419 Encounter for gynecological examination (general) (routine) without abnormal findings: Secondary | ICD-10-CM

## 2013-10-25 ENCOUNTER — Telehealth: Payer: Self-pay | Admitting: Obstetrics & Gynecology

## 2013-10-25 NOTE — Telephone Encounter (Signed)
LMTCB to schedule a new patient doctor referral.

## 2013-12-05 ENCOUNTER — Encounter: Payer: BC Managed Care – PPO | Admitting: Nurse Practitioner

## 2013-12-16 IMAGING — US US OB DETAIL+14 WK
1 series · 12 of 28 positions shown · non-contrast
Comparison: none

[Series 1: us ob detail +14 wk · 12 of 90 slices shown]
[im 4/90]
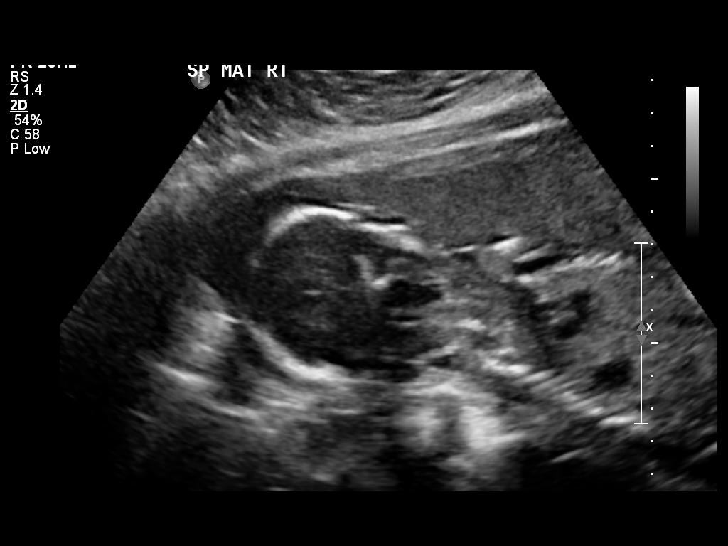
[im 10/90]
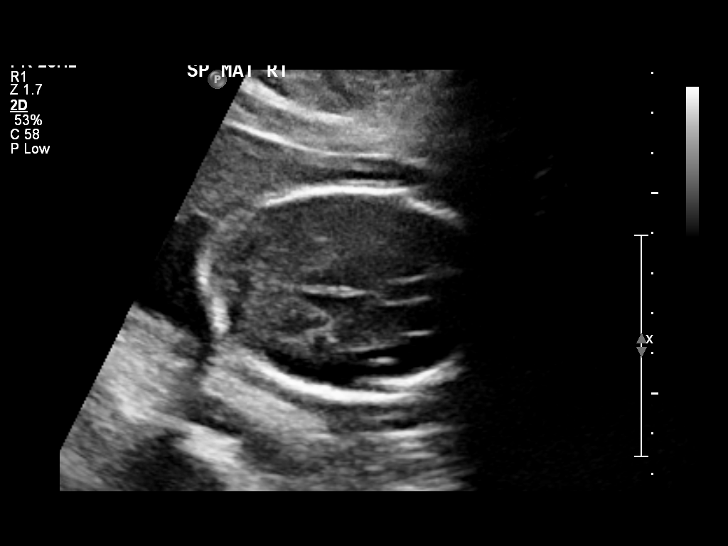
[im 17/90]
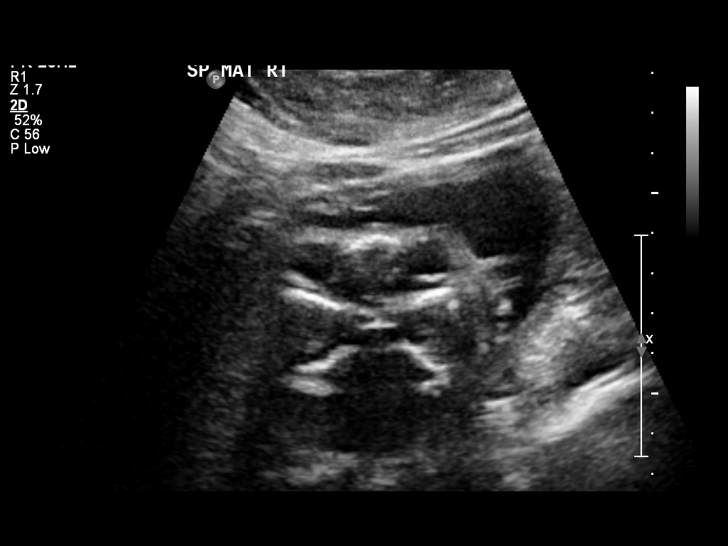
[im 27/90]
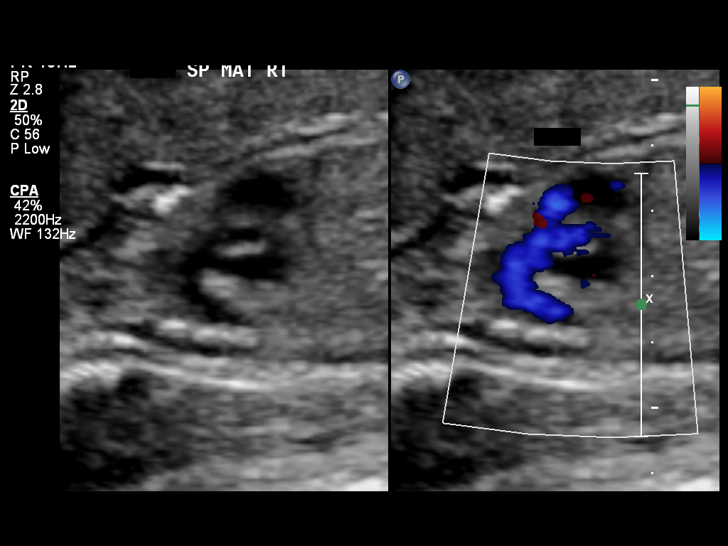
[im 33/90]
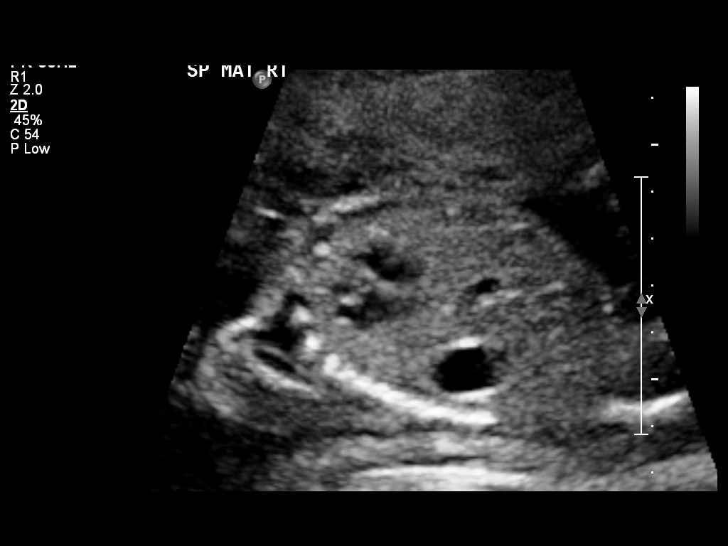
[im 40/90]
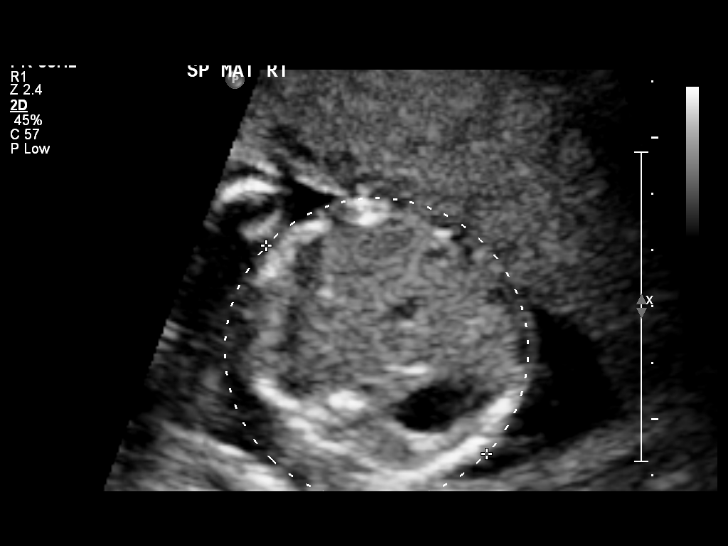
[im 50/90]
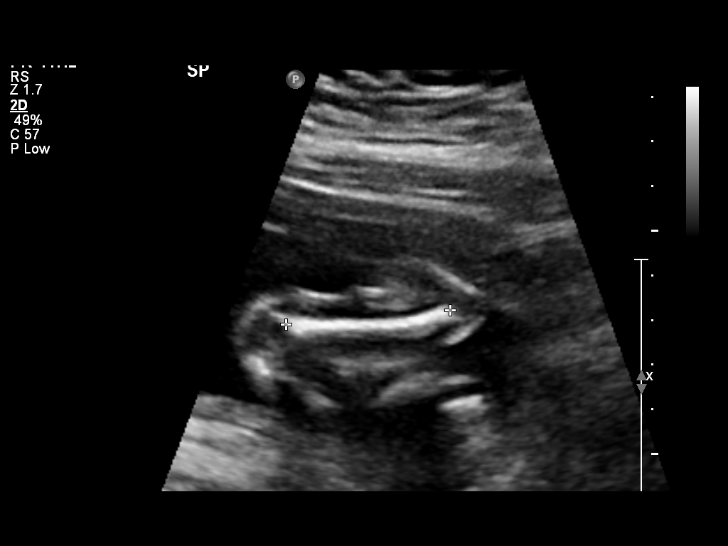
[im 57/90]
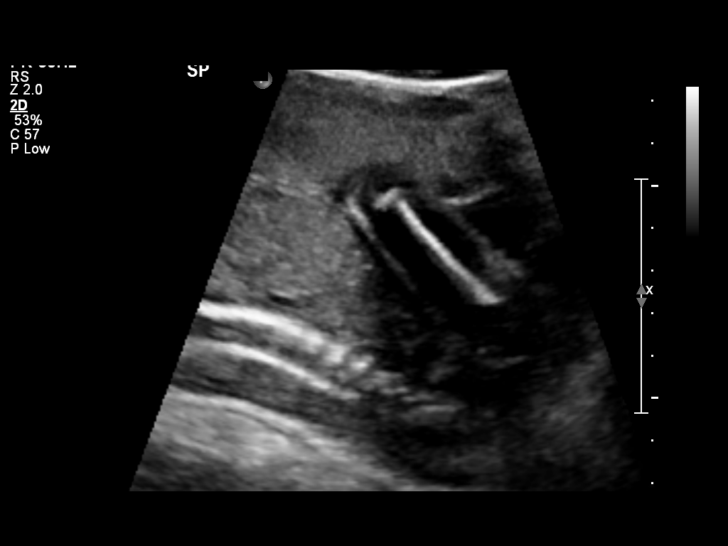
[im 63/90]
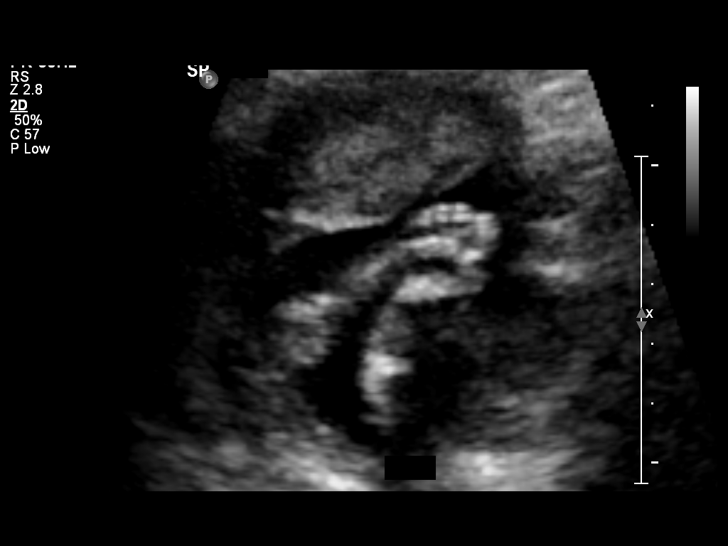
[im 73/90]
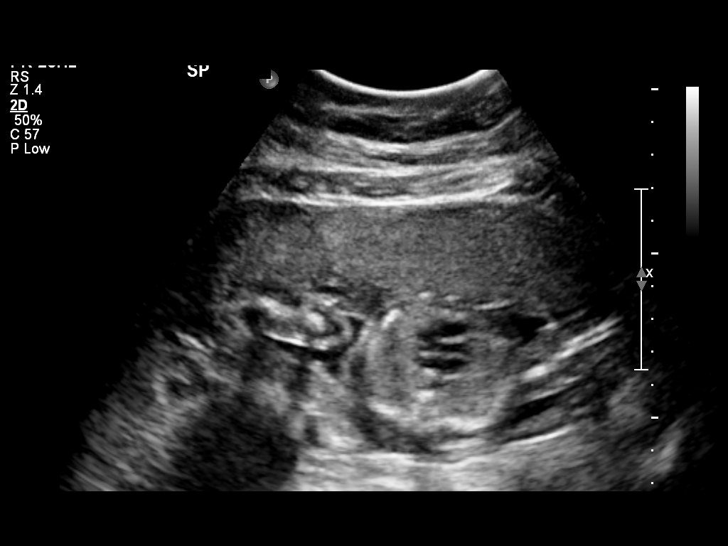
[im 80/90]
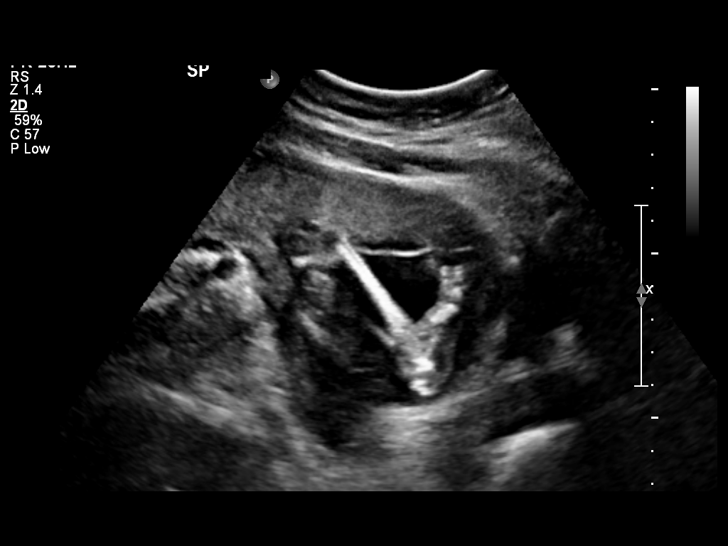
[im 86/90]
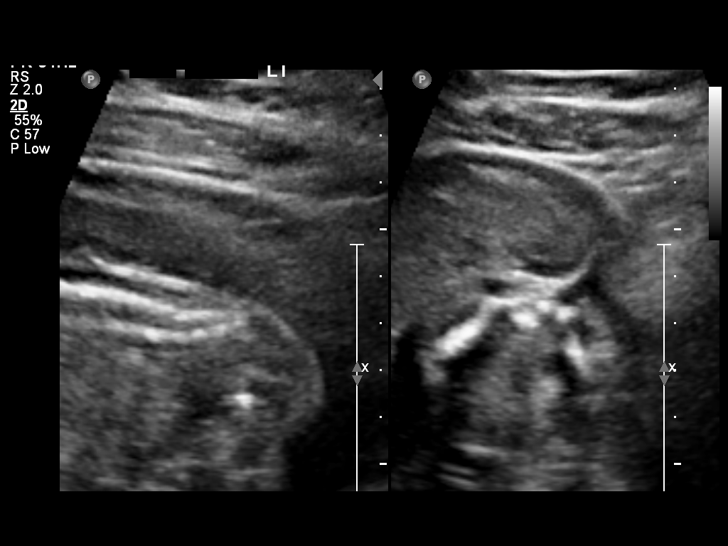

[12 of 28 positions shown; findings below may reference images not displayed]

OBSTETRICS REPORT
                      (Signed Final 07/05/2011 [DATE])

 Order#:         [id number]
Procedures

 US OB DETAIL + 14 WK                                  76811.0
Indications

 Detailed fetal anatomic survey
 Unsure of LMP;  Establish Gestational [AGE]
 Uterine fibroids
Fetal Evaluation

 Fetal Heart Rate:  150                         bpm
 Cardiac Activity:  Observed
 Presentation:      Breech
 Placenta:          Anterior, above cervical os
 P. Cord            Visualized
 Insertion:

 Amniotic Fluid
 AFI FV:      Subjectively within normal limits
                                             Larg Pckt:     4.4  cm
Biometry

 BPD:     50.4  mm    G. Age:   21w 2d                CI:        64.98   70 - 86
                                                      FL/HC:      18.5   18.4 -

 HC:     200.9  mm    G. Age:   22w 2d       55  %    HC/AC:      1.18   1.06 -

 AC:       170  mm    G. Age:   22w 0d       47  %    FL/BPD:     73.6   71 - 87
 FL:      37.1  mm    G. Age:   21w 6d       39  %    FL/AC:      21.8   20 - 24
 HUM:     36.7  mm    G. Age:   22w 6d       72  %

 Est. FW:     461  gm          1 lb      47  %
Gestational Age

 LMP:           20w 0d       Date:   02/15/11                 EDD:   11/22/11
 U/S Today:     21w 6d                                        EDD:   11/09/11
 Best:          21w 6d    Det. By:   U/S (07/05/11)           EDD:   11/09/11
Anatomy
 Cranium:           Appears normal      Aortic Arch:       Appears normal
 Fetal Cavum:       Appears normal      Ductal Arch:       Appears normal
 Ventricles:        Left ventricle      Diaphragm:         Appears normal
                    measures
                    mm
 Choroid Plexus:    Appears normal      Stomach:           Appears
                                                           normal, left
                                                           sided
 Cerebellum:        Appears normal      Abdomen:           Appears normal
 Posterior Fossa:   Appears normal      Abdominal Wall:    Appears nml
                                                           (cord insert,
                                                           abd wall)
 Nuchal Fold:       Not applicable      Cord Vessels:      Appears normal
                    (>20 wks GA)                           (3 vessel cord)
 Face:              Appears normal      Kidneys:           Appear normal
                    (lips/profile/orbit
                    s)
 Heart:             Appears normal      Bladder:           Appears normal
                    (4 chamber &
                    axis)
 RVOT:              Appears normal      Spine:             Appears normal
 LVOT:              Appears normal      Limbs:             Appears normal
                                                           (hands, ankles,
                                                           feet)

 Other:     Heels and 5th digit appears normal. Technically difficult
            due to maternal habitus and fetal position.
Cervix Uterus Adnexa

 Cervical Length:   3.1       cm

 Cervix:       Normal appearance by transabdominal scan.
 Uterus:       No abnormality visualized.
 Cul De Sac:   No free fluid seen.

 Left Ovary:   Not visualized.
 Right Ovary:  Not visualized.
 Adnexa:     No abnormality visualized.
Impression

 Single live IUP in breech presentation.   Suggest best dating
 by today's exam.
 Unilateral mild left ventriculomegaly, measuring 11mm. This
 is rarely reported in the literature, and generally karyotype
 testing should be considered as well as follow up for possible
 developing concomitant neurological or other anatomic
 abnormalities not seen on today's exam. If this finding
 persists, fetal MRI in the late 2nd/early 3rd trimester could be
 considered. Occasionally this may resolve, and the long-term
 prognosis is not known, but is thought to be better than
 bilateral severe ventriculomegaly, for example. Reference:
 [URL]

 questions or concerns.

## 2013-12-31 ENCOUNTER — Encounter: Payer: Self-pay | Admitting: Medical

## 2014-01-01 ENCOUNTER — Encounter: Payer: Self-pay | Admitting: Nurse Practitioner

## 2014-01-01 ENCOUNTER — Ambulatory Visit (INDEPENDENT_AMBULATORY_CARE_PROVIDER_SITE_OTHER): Payer: BC Managed Care – PPO | Admitting: Nurse Practitioner

## 2014-01-01 VITALS — BP 120/80 | HR 82 | Resp 18 | Ht 65.5 in | Wt 250.0 lb

## 2014-01-01 DIAGNOSIS — D259 Leiomyoma of uterus, unspecified: Secondary | ICD-10-CM

## 2014-01-01 DIAGNOSIS — Z01419 Encounter for gynecological examination (general) (routine) without abnormal findings: Secondary | ICD-10-CM

## 2014-01-01 DIAGNOSIS — Z0000 Encounter for general adult medical examination without abnormal findings: Secondary | ICD-10-CM

## 2014-01-01 NOTE — Patient Instructions (Signed)

## 2014-01-01 NOTE — Progress Notes (Addendum)
33 y.o. G1P1001 Single African American Fe here for NGYN annual exam.  Menses are regular, lasting about 3 days. Moderate, some cramps,  No PMS.  Baby Boy is now 12 years old. Breast fed for 6-7 months. Used no method of birth control before pregnancy.  Currently abstinent. Not SA X 2 years.  All STD'S were normal during pregnancy.  She is having occasional urine leakage with cough - mostly when bladder is full.   She describes a vaginal pain that occurs just before onset of menses.  It is a 'twinge'. But is persistent every month.  After menses flow begins the pain goes away.  Seems to be worse on right vaginal wall and or cervix area.  Did not have the pain prior to birth of son. She had a normal delivery without episiotomy. No cervical trauma or colpo.  Patient's last menstrual period was 12/05/2013.          Sexually active: No.  The current method of family planning is none.    Exercising: Yes.    walking qd, cardio 2x/wk Smoker:  no  Health Maintenance: Pap:  2013 WNL  TDaP:  11/10/2011 Labs: PCP- Chana Bode, MD   reports that she has never smoked. She has never used smokeless tobacco. She reports that she does not drink alcohol or use illicit drugs.  Past Medical History  Diagnosis Date  . Allergy   . Presence of partial dental prosthetic device   . Anemia     Past Surgical History  Procedure Laterality Date  . Right knee surgery  11-12 years ago    arthoscopic surgery for meniscal tear, Dr. Marlou Sa  . Tooth extraction      No current outpatient prescriptions on file.   No current facility-administered medications for this visit.    Family History  Problem Relation Age of Onset  . Diabetes Mother   . Other Father     unknown  . Anxiety disorder Sister   . Diabetes Sister   . Hypertension Sister   . Breast cancer Maternal Aunt     breast late 30's  . Cancer Maternal Grandmother     pancreatic  . Heart disease Neg Hx   . Stroke Neg Hx     ROS:  Pertinent items  are noted in HPI.  Otherwise, a comprehensive ROS was negative.  Exam:   BP 120/80 mmHg  Pulse 82  Resp 18  Ht 5' 5.5" (1.664 m)  Wt 250 lb (113.399 kg)  BMI 40.95 kg/m2  LMP 12/05/2013 Height: 5' 5.5" (166.4 cm)  Ht Readings from Last 3 Encounters:  01/01/14 5' 5.5" (1.664 m)  10/16/13 5\' 4"  (1.626 m)  11/09/11 5\' 6"  (1.676 m)    General appearance: alert, cooperative and appears stated age Head: Normocephalic, without obvious abnormality, atraumatic Neck: no adenopathy, supple, symmetrical, trachea midline and thyroid normal to inspection and palpation Lungs: clear to auscultation bilaterally Breasts: normal appearance, no masses or tenderness Heart: regular rate and rhythm Abdomen: soft, non-tender; no masses,  no organomegaly Extremities: extremities normal, atraumatic, no cyanosis or edema Skin: Skin color, texture, turgor normal. No rashes or lesions Lymph nodes: Cervical, supraclavicular, and axillary nodes normal. No abnormal inguinal nodes palpated Neurologic: Grossly normal   Pelvic: External genitalia:  no lesions              Urethra:  normal appearing urethra with no masses, tenderness or lesions  Bartholin's and Skene's: normal                 Vagina: normal appearing vagina with normal color and discharge, no lesions.  Unable to reproduce same pain on bimanual.  No evidence of vaginal wall cyst.              Cervix: anteverted              Pap taken: Yes.   Bimanual Exam:  Uterus:  normal size, contour, position, consistency, mobility, non-tender              Adnexa: no mass, fullness, tenderness               Rectovaginal: Confirms               Anus:  normal sphincter tone, no lesions  A:  Well Woman with normal exam  Not SA X 2 years   Normal menses  History of uterine fibroids  History of pelvic/ cervical/ vaginal pain with onset of menses since birth of son  P:   Reviewed health and wellness pertinent to exam  Pap smear taken  today  Will get a PUS and follow  Counseled on breast self exam, STD prevention, adequate intake of calcium and vitamin D, diet and exercise return annually or prn  Discussion of vaginal / cervical / uterine pain and evaluation beyond her AEX visit was 15 minutes.   An After Visit Summary was printed and given to the patient.

## 2014-01-02 NOTE — Progress Notes (Signed)
Encounter reviewed by Dr. Brook Silva.  

## 2014-01-03 ENCOUNTER — Telehealth: Payer: Self-pay | Admitting: Nurse Practitioner

## 2014-01-03 DIAGNOSIS — D259 Leiomyoma of uterus, unspecified: Secondary | ICD-10-CM

## 2014-01-03 LAB — IPS PAP TEST WITH HPV

## 2014-01-03 NOTE — Telephone Encounter (Signed)
Spoke with patient. Advised that per benefit quote received, she will be responsible to pay $294.23 when she comes in for PUS. Explained that PUS is covered 80/20 by her health plan. Patient agreeable. Patient declines to schedule today. States that she will call back "some time next month" to schedule.

## 2014-01-07 NOTE — Telephone Encounter (Signed)
Thank you for the note - I reviewed her symptoms with Dr. Quincy Simmonds and she feels the best way to see what is causing her pain is for an ultrasound.  I hope she calls back to schedule.

## 2014-01-21 NOTE — Telephone Encounter (Signed)
Call to patient. Scheduled patient 12.10.2015. Patient was reminded to bring payment of $294.23 to visit. Patient agreeable. Advised patient of 72 hour cancellation policy and $468 cancellation fee. Patient agreeable.

## 2014-01-21 NOTE — Telephone Encounter (Signed)
Pt calling to schedule an ultrasound.

## 2014-01-21 NOTE — Telephone Encounter (Signed)
New order for PUS placed under Dr.Silva. Patient has appointment scheduled for 02/07/14 with Dr.Silva.  Routing to provider for final review. Patient agreeable to disposition. Will close encounter

## 2014-02-07 ENCOUNTER — Ambulatory Visit (INDEPENDENT_AMBULATORY_CARE_PROVIDER_SITE_OTHER): Payer: BC Managed Care – PPO

## 2014-02-07 ENCOUNTER — Ambulatory Visit (INDEPENDENT_AMBULATORY_CARE_PROVIDER_SITE_OTHER): Payer: BC Managed Care – PPO | Admitting: Obstetrics and Gynecology

## 2014-02-07 ENCOUNTER — Encounter: Payer: Self-pay | Admitting: Obstetrics and Gynecology

## 2014-02-07 VITALS — BP 118/78 | HR 80 | Ht 65.5 in | Wt 242.0 lb

## 2014-02-07 DIAGNOSIS — N946 Dysmenorrhea, unspecified: Secondary | ICD-10-CM

## 2014-02-07 DIAGNOSIS — D259 Leiomyoma of uterus, unspecified: Secondary | ICD-10-CM

## 2014-02-07 NOTE — Progress Notes (Signed)
Subjective  Patient is here today for pelvic ultrasound.   Vaginal pain prior to menses since vaginal delivery.   Had baby boy 6 pounds 14 ounces, vaginal delivery without assistance.   No excessive suturing.   Can predict that menstruation is coming when the pain occurs.  Feels pain but it is light.  Does not interfere with lifestyle. No use of pain medication.  Pain does not get worse when cycle actually begins.  Never increases.  Pain can be right or left.  No pain any other time.  Menses are regular.   Not using contraception.  Abstinence.  Denies dysuria or hematuria.  Some right sided pain prior to urination in the am if did not void during the night.  Denies problems with bowel movements.  No constipation.   History of fibroids.  Was told that she had them when she had a visit to an emergency department many years ago.  No evidence of fibroids since then.   Objective   Images and report reviewed with patient. Uterus normal with no myometrial masses.   EMS 6.99 and symmetric.  Ovaries normal with follicular activity.  Small amount free fluid.    Assessment  Mild dysmenorrhea.  No classic signs of endometriosis.   Plan  Reassurance and bservation.  Questions answered.  Follow up for routine care and if pain increases.   15 minutes face to face time of which over 50% was for counseling.   After visit summary to patient.

## 2014-02-12 IMAGING — US US OB FOLLOW-UP
1 series · 12 of 28 positions shown · non-contrast
Comparison: none

[Series 1: us ob follow-up · 12 of 41 slices shown]
[im 2/41]
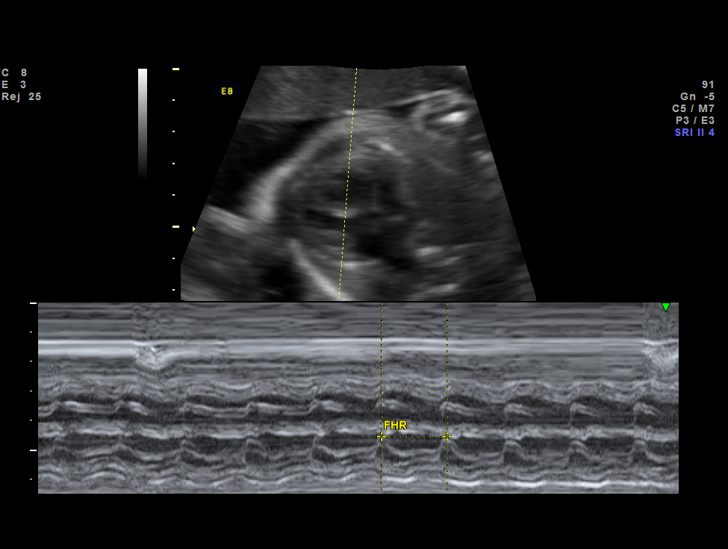
[im 5/41]
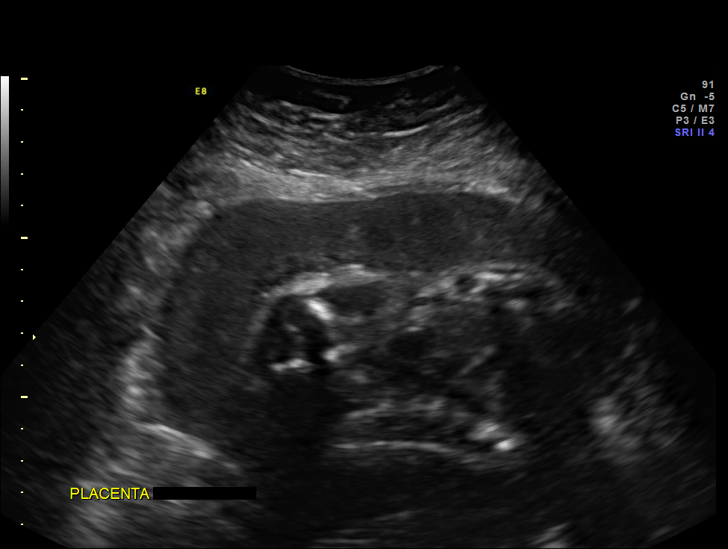
[im 8/41]
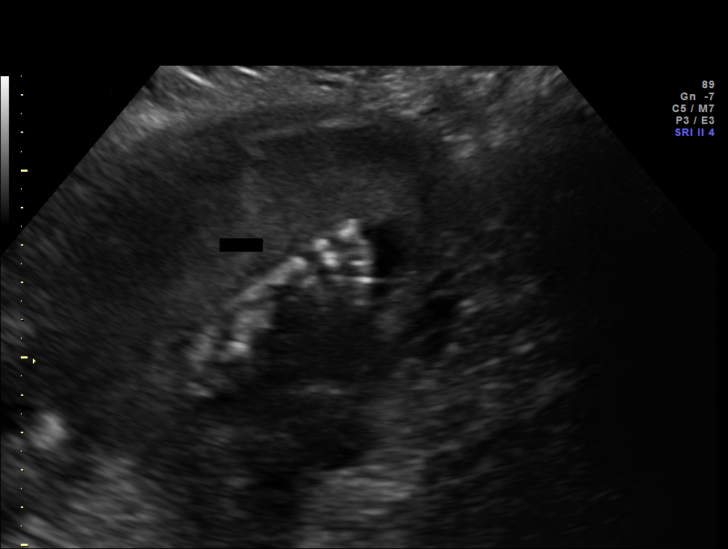
[im 12/41]
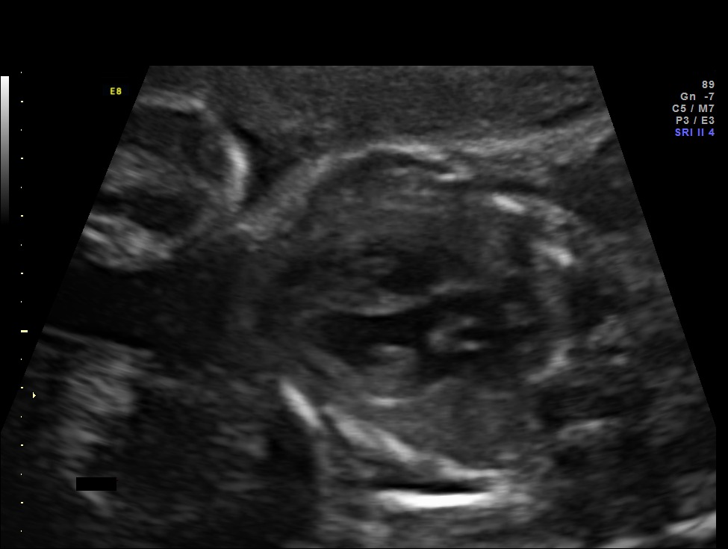
[im 15/41]
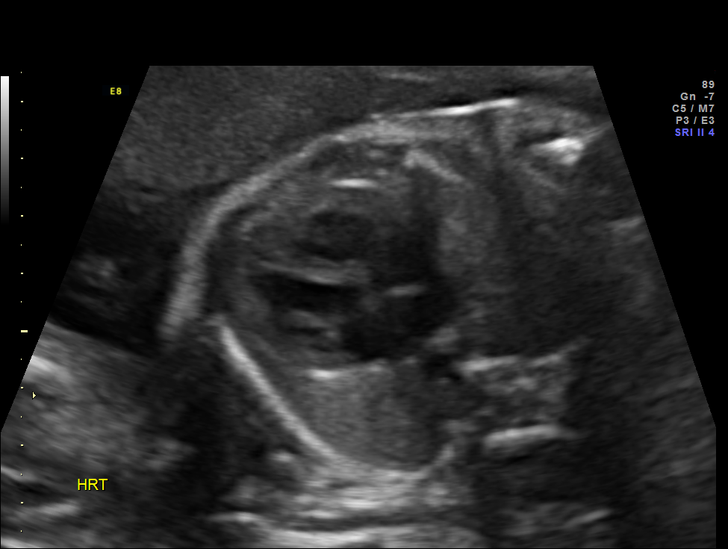
[im 18/41]
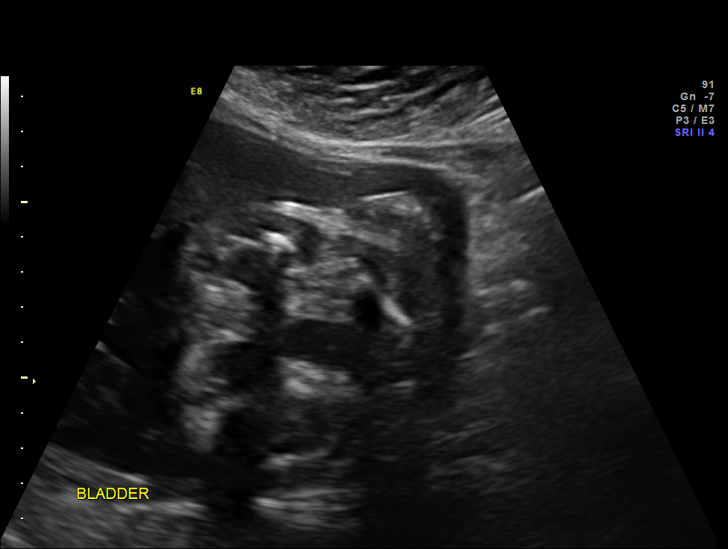
[im 23/41]
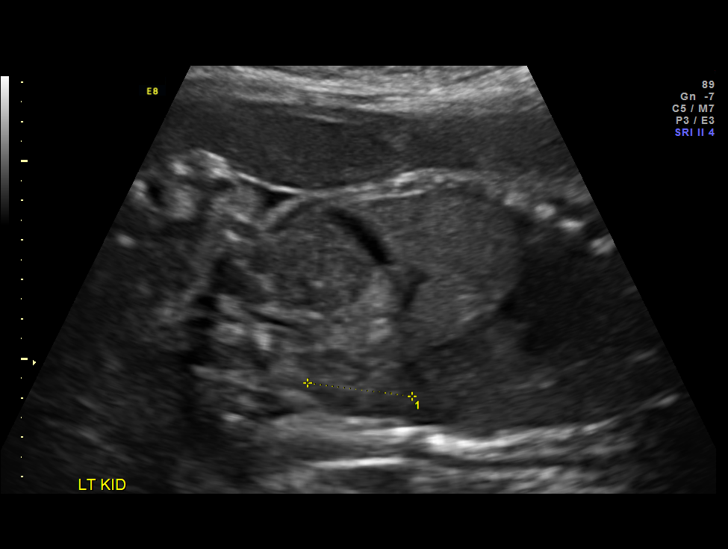
[im 26/41]
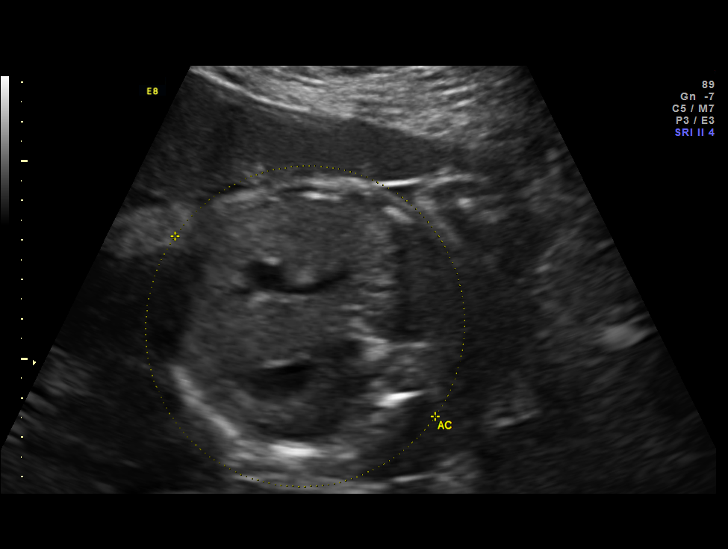
[im 29/41]
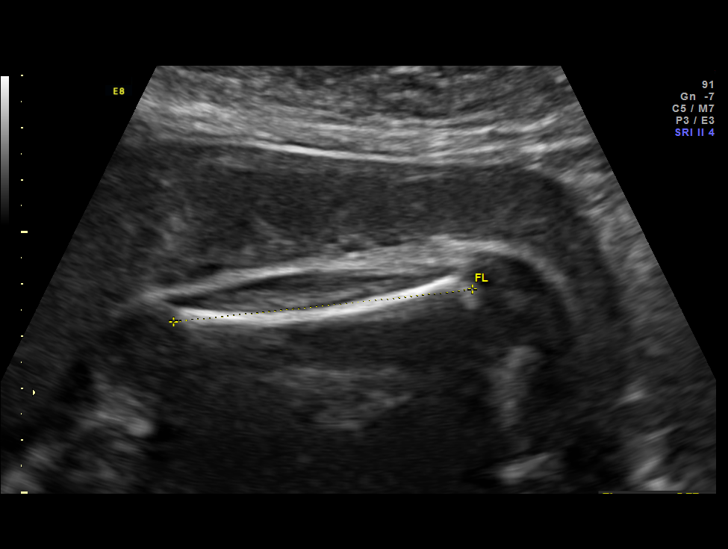
[im 33/41]
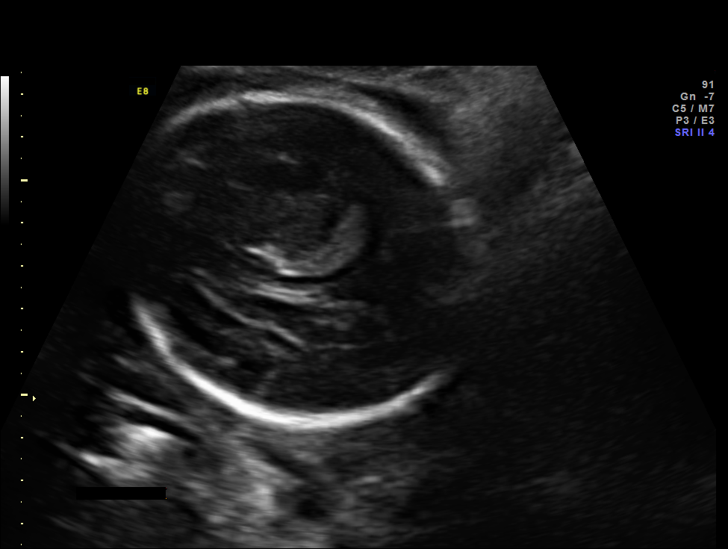
[im 36/41]
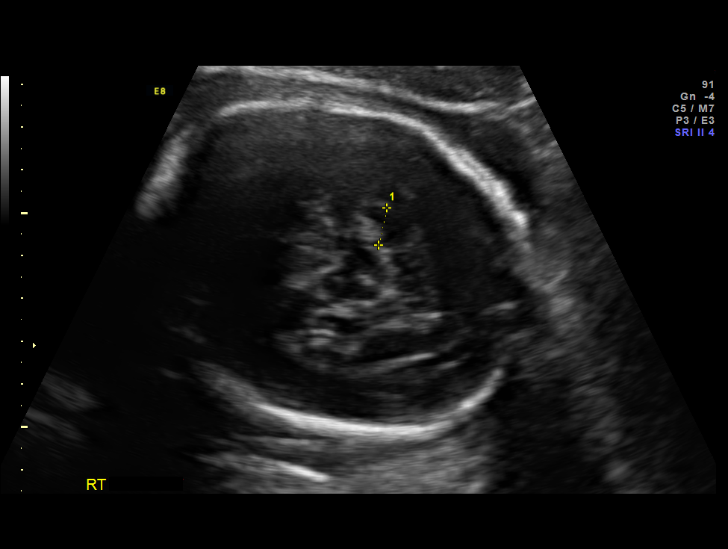
[im 39/41]
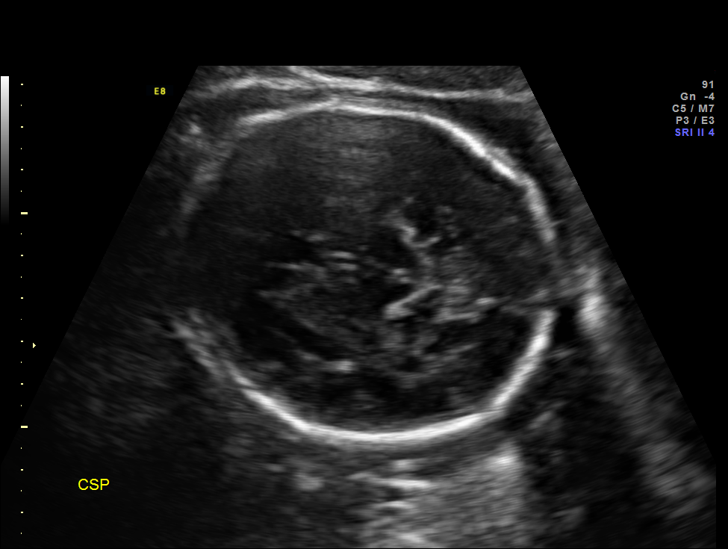

[12 of 28 positions shown; findings below may reference images not displayed]

OBSTETRICS REPORT
                      (Signed Final 09/01/2011 [DATE])

                 NP                                    [HOSPITAL]
 Order#:         [id number]
Procedures

 US OB FOLLOW UP                                       76816.1
Indications

 Cerebral ventriculomegaly (Unilateral left)
 Uterine fibroids
 Assess Fetal Growth / Estimated Fetal Weight
Fetal Evaluation

 Fetal Heart Rate:  153                          bpm
 Cardiac Activity:  Observed
 Presentation:      Cephalic
 Placenta:          Anterior, above cervical os
 P. Cord            Previously Visualized
 Insertion:

 Amniotic Fluid
 AFI FV:      Subjectively within normal limits
                                             Larg Pckt:     6.0  cm
Biometry

 BPD:     76.6  mm     G. Age:  30w 5d                CI:         80.6   70 - 86
 OFD:       95  mm                                    FL/HC:      21.0   19.2 -

 HC:     275.5  mm     G. Age:  30w 1d       17  %    HC/AC:      1.09   0.99 -

 AC:     253.1  mm     G. Age:  29w 4d       27  %    FL/BPD:     75.5   71 - 87
 FL:      57.8  mm     G. Age:  30w 2d       39  %    FL/AC:      22.8   20 - 24
 HUM:     51.5  mm     G. Age:  30w 1d       52  %

 Est. FW:    2121  gm      3 lb 4 oz     49  %
Gestational Age

 LMP:           28w 2d        Date:  02/15/11                 EDD:   11/22/11
 U/S Today:     30w 1d                                        EDD:   11/09/11
 Best:          30w 1d     Det. By:  U/S (07/05/11)           EDD:   11/09/11
Anatomy
 Cranium:           Appears normal      Aortic Arch:       Previously seen
 Fetal Cavum:       Appears normal      Ductal Arch:       Previously seen
 Ventricles:        Appears normal      Diaphragm:         Previously seen
 Choroid Plexus:    Appears normal      Stomach:           Appears
                                                           normal, left
                                                           sided
 Cerebellum:        Previously seen     Abdomen:           Appears normal
 Posterior Fossa:   Previously seen     Abdominal Wall:    Previously seen
 Nuchal Fold:       Not applicable      Cord Vessels:      Previously seen
                    (>20 wks GA)
 Face:              Previously seen     Kidneys:           Appear normal
 Heart:             Appears normal      Bladder:           Appears normal
                    (4 chamber &
                    axis)
 RVOT:              Appears normal      Spine:             Previously seen
 LVOT:              Appears normal      Limbs:             Previously seen

 Other:     Fetus appears to be a male.Heels and 5th digit
            previously seen. Nasal bone previously visualized.
Cervix Uterus Adnexa

 Cervix:       Not visualized (advanced GA >29 wks)
Comments

 The patient returns for follow up due to isolated mild
 ventriculomegaly.  The lateral ventricles measure
 approximately 9mm (within normal limits)- improved since last
 visit
Impression

 Single IUP at 30 [DATE] weeks
 Mild ventriculomegaly appears to have resolved
 Otherwise normal anatomic fetal survey
 Fetal growth is appropriate (49th percentile)
 Normal amniotic fluid volume
Recommendations

 Recommend follow up ultrasound in 4 weeks to reevaluate.
 Would recommend cranial imaging studies of the newborn
 after delivery.

 questions or concerns.

## 2014-11-27 ENCOUNTER — Emergency Department (HOSPITAL_COMMUNITY)
Admission: EM | Admit: 2014-11-27 | Discharge: 2014-11-27 | Disposition: A | Discharge status: Home / Self Care | Payer: BLUE CROSS/BLUE SHIELD | Attending: Emergency Medicine | Admitting: Emergency Medicine

## 2014-11-27 ENCOUNTER — Encounter (HOSPITAL_COMMUNITY): Payer: Self-pay | Admitting: *Deleted

## 2014-11-27 ENCOUNTER — Emergency Department (HOSPITAL_COMMUNITY): Payer: BLUE CROSS/BLUE SHIELD

## 2014-11-27 DIAGNOSIS — Z972 Presence of dental prosthetic device (complete) (partial): Secondary | ICD-10-CM | POA: Insufficient documentation

## 2014-11-27 DIAGNOSIS — R079 Chest pain, unspecified: Secondary | ICD-10-CM | POA: Diagnosis present

## 2014-11-27 DIAGNOSIS — R0789 Other chest pain: Secondary | ICD-10-CM | POA: Diagnosis not present

## 2014-11-27 DIAGNOSIS — Z862 Personal history of diseases of the blood and blood-forming organs and certain disorders involving the immune mechanism: Secondary | ICD-10-CM | POA: Diagnosis not present

## 2014-11-27 MED ORDER — IBUPROFEN 800 MG PO TABS: 800.0000 mg | ORAL_TABLET | Freq: Three times a day (TID) | ORAL | Status: DC

## 2014-11-27 MED ORDER — CYCLOBENZAPRINE HCL 5 MG PO TABS: 5.0000 mg | ORAL_TABLET | Freq: Three times a day (TID) | ORAL | Status: DC | PRN

## 2014-11-27 MED ORDER — CYCLOBENZAPRINE HCL 10 MG PO TABS
5.0000 mg | ORAL_TABLET | Freq: Once | ORAL | Status: AC
  Administered 2014-11-27: 5 mg via ORAL
  Filled 2014-11-27: qty 1

## 2014-11-27 MED ORDER — IBUPROFEN 800 MG PO TABS
800.0000 mg | ORAL_TABLET | Freq: Once | ORAL | Status: AC
  Administered 2014-11-27: 800 mg via ORAL
  Filled 2014-11-27: qty 1

## 2014-11-27 NOTE — ED Provider Notes (Signed)
CSN: [id number]     Arrival date & time 11/27/14  2103 History   First MD Initiated Contact with Patient 11/27/14 2138     Chief Complaint  Patient presents with  . Chest Pain     (Consider location/radiation/quality/duration/timing/severity/associated sxs/prior Treatment) The history is provided by the patient.  Saranya Harlin is a 34 y.o. female here with chest pain. Right-sided chest pain when she got off from the couch 2 hours ago. It is worse when she moves around and worse with deep breath. Denies any shortness of breath. Denies any fevers or cough. Denies any history of CAD. Denies any recent travel or leg swelling or history of DVT or PE. Last menstrual period was about 3 weeks ago. Not a smoker. No family hx of early cardiac death.     Past Medical History  Diagnosis Date  . Allergy   . Presence of partial dental prosthetic device   . Anemia    Past Surgical History  Procedure Laterality Date  . Right knee surgery  11-12 years ago    arthoscopic surgery for meniscal tear, Dr. Marlou Sa  . Tooth extraction     Family History  Problem Relation Age of Onset  . Diabetes Mother   . Other Father     unknown  . Anxiety disorder Sister   . Diabetes Sister   . Hypertension Sister   . Breast cancer Maternal Aunt     breast late 30's  . Cancer Maternal Grandmother     pancreatic  . Heart disease Neg Hx   . Stroke Neg Hx    Social History  Substance Use Topics  . Smoking status: Never Smoker   . Smokeless tobacco: Never Used  . Alcohol Use: No   OB History    Gravida Para Term Preterm AB TAB SAB Ectopic Multiple Living   1 1 1  0 0 0 0 0 0 1     Review of Systems  Cardiovascular: Positive for chest pain.  All other systems reviewed and are negative.     Allergies  Review of patient's allergies indicates no known allergies.  Home Medications   Prior to Admission medications   Not on File   BP 132/83 mmHg  Pulse 71  Temp(Src) 98.8 F (37.1 C)  Resp 20  Ht 5'  6" (1.676 m)  Wt 247 lb 3 oz (112.124 kg)  BMI 39.92 kg/m2  SpO2 99%  LMP 11/06/2014 Physical Exam  Constitutional: She is oriented to person, place, and time. She appears well-developed and well-nourished.  HENT:  Head: Normocephalic.  Mouth/Throat: Oropharynx is clear and moist.  Eyes: Conjunctivae are normal. Pupils are equal, round, and reactive to light.  Neck: Normal range of motion. Neck supple.  Cardiovascular: Normal rate, regular rhythm and normal heart sounds.   Pulmonary/Chest: Effort normal and breath sounds normal. No respiratory distress. She has no wheezes. She has no rales.  Reproducible R sided chest tenderness   Abdominal: Soft. Bowel sounds are normal. She exhibits no distension. There is no tenderness. There is no rebound.  Musculoskeletal: Normal range of motion. She exhibits no edema or tenderness.  Neurological: She is alert and oriented to person, place, and time. No cranial nerve deficit. Coordination normal.  Skin: Skin is warm and dry.  Psychiatric: She has a normal mood and affect. Her behavior is normal. Judgment and thought content normal.  Nursing note and vitals reviewed.   ED Course  Procedures (including critical care time) Labs Review Labs Reviewed -  No data to display  Imaging Review Dg Chest 2 View  11/27/2014   CLINICAL DATA:  Right chest pain.  EXAM: CHEST  2 VIEW  COMPARISON:  None.  FINDINGS: The lung volumes are slightly low. The heart is top-normal size accounting for the low lung volumes. The mediastinal contour appears normal. No pneumothorax. No pleural effusion. No pulmonary edema. Mild left lower lobe curvilinear opacity, favor mild atelectasis. Visualized osseous structures appear intact.  IMPRESSION: Low lung volumes. Mild curvilinear left lower lobe opacities, favor atelectasis.   Electronically Signed   By: Ilona Sorrel M.D.   On: 11/27/2014 21:43   I have personally reviewed and evaluated these images and lab results as part of my  medical decision-making.   EKG Interpretation   Date/Time:  Wednesday November 27 2014 21:08:00 EDT Ventricular Rate:  71 PR Interval:  170 QRS Duration: 78 QT Interval:  358 QTC Calculation: 389 R Axis:   53 Text Interpretation:  Normal sinus rhythm Normal ECG No previous ECGs  available Confirmed by YAO  MD, DAVID ([id number]) on 11/27/2014 9:38:37 PM      MDM   Final diagnoses:  None    Sigourney Portillo is a 34 y.o. female here with R sided chest pain that is reproducible. Likely muscle strain. EKG unremarkable. I doubt ACS. PERC neg so no need for d-dimer. Doesn't appear pale. Vitals stable. Will dc home with motrin, flexeril.     Wandra Arthurs, MD 11/27/14 2213

## 2014-11-27 NOTE — ED Notes (Signed)
The pts pain is worse with movement and with inspiration

## 2014-11-27 NOTE — Discharge Instructions (Signed)
Take motrin for pain.  Take flexeril for muscle strain.  No heavy lifting.   See your doctor.   Return to ER if you have severe pain, shortness of breath.

## 2014-11-27 NOTE — ED Notes (Signed)
The pt is c/o rt sided chest pain for 2 hours.  No previous history.  No n v or diarrhea.  No sob or a cough.  lmp this month the first

## 2015-01-03 ENCOUNTER — Encounter: Payer: Self-pay | Admitting: Nurse Practitioner

## 2015-01-03 ENCOUNTER — Ambulatory Visit (INDEPENDENT_AMBULATORY_CARE_PROVIDER_SITE_OTHER): Payer: BLUE CROSS/BLUE SHIELD | Admitting: Nurse Practitioner

## 2015-01-03 VITALS — BP 120/74 | HR 80 | Ht 64.5 in | Wt 247.0 lb

## 2015-01-03 DIAGNOSIS — R8290 Unspecified abnormal findings in urine: Secondary | ICD-10-CM | POA: Diagnosis not present

## 2015-01-03 DIAGNOSIS — D259 Leiomyoma of uterus, unspecified: Secondary | ICD-10-CM | POA: Diagnosis not present

## 2015-01-03 DIAGNOSIS — Z0000 Encounter for general adult medical examination without abnormal findings: Secondary | ICD-10-CM

## 2015-01-03 DIAGNOSIS — Z01419 Encounter for gynecological examination (general) (routine) without abnormal findings: Secondary | ICD-10-CM

## 2015-01-03 LAB — POCT URINALYSIS DIPSTICK
Bilirubin, UA: NEGATIVE
Blood, UA: NEGATIVE
GLUCOSE UA: NEGATIVE
Ketones, UA: NEGATIVE
NITRITE UA: NEGATIVE
Protein, UA: NEGATIVE
UROBILINOGEN UA: NEGATIVE
pH, UA: 5

## 2015-01-03 NOTE — Patient Instructions (Signed)

## 2015-01-03 NOTE — Progress Notes (Signed)
Patient ID: Kelly Cox, female   DOB: 10-29-1980, 34 y.o.   MRN: [id number] 34 y.o. G1P1001 Single  African American Fe here for annual exam.  Menses now at 3 days.  Slight cramps.  Flow is heavy X 1 day with clots.  No new health diagnosis.  Son is nsow 57 yrs old.  Patient's last menstrual period was 12/29/2014 (exact date).          Sexually active: No. Not at this time. The current method of family planning is abstinence.    Exercising: Yes.    walking two times per week; will start at home exercise next week Smoker:  no  Health Maintenance: Pap:01/01/14, Negative with neg HR HPV TDaP: 11/10/2011 Labs: PCP  Urine: 2+ leuk's - no symptoms   reports that she has never smoked. She has never used smokeless tobacco. She reports that she does not drink alcohol or use illicit drugs.  Past Medical History  Diagnosis Date  . Allergy   . Presence of partial dental prosthetic device   . Anemia     Past Surgical History  Procedure Laterality Date  . Right knee surgery  11-12 years ago    arthoscopic surgery for meniscal tear, Dr. Marlou Sa  . Tooth extraction      No current outpatient prescriptions on file.   No current facility-administered medications for this visit.    Family History  Problem Relation Age of Onset  . Diabetes Mother   . Other Father     unknown  . Anxiety disorder Sister   . Diabetes Sister   . Hypertension Sister   . Breast cancer Maternal Aunt     breast late 30's  . Cancer Maternal Grandmother     pancreatic  . Heart disease Neg Hx   . Stroke Neg Hx     ROS:  Pertinent items are noted in HPI.  Otherwise, a comprehensive ROS was negative.  Exam:   BP 120/74 mmHg  Pulse 80  Ht 5' 4.5" (1.638 m)  Wt 247 lb (112.038 kg)  BMI 41.76 kg/m2  LMP 12/29/2014 (Exact Date)  Breastfeeding? No Height: 5' 4.5" (163.8 cm) Ht Readings from Last 3 Encounters:  01/03/15 5' 4.5" (1.638 m)  11/27/14 5\' 6"  (1.676 m)  02/07/14 5' 5.5" (1.664 m)    General  appearance: alert, cooperative and appears stated age Head: Normocephalic, without obvious abnormality, atraumatic Neck: no adenopathy, supple, symmetrical, trachea midline and thyroid normal to inspection and palpation Lungs: clear to auscultation bilaterally Breasts: normal appearance, no masses or tenderness Heart: regular rate and rhythm Abdomen: soft, non-tender; no masses,  no organomegaly Extremities: extremities normal, atraumatic, no cyanosis or edema Skin: Skin color, texture, turgor normal. No rashes or lesions Lymph nodes: Cervical, supraclavicular, and axillary nodes normal. No abnormal inguinal nodes palpated Neurologic: Grossly normal   Pelvic: External genitalia:  no lesions              Urethra:  normal appearing urethra with no masses, tenderness or lesions              Bartholin's and Skene's: normal                 Vagina: normal appearing vagina with normal color and discharge, no lesions              Cervix: anteverted              Pap taken: No. Bimanual Exam:  Uterus:  normal size, contour,  position, consistency, mobility, non-tender              Adnexa: no mass, fullness, tenderness               Rectovaginal: Confirms               Anus:  normal sphincter tone, no lesions  Chaperone present: no  A:  Well Woman with normal exam  Not SA X 3 years  Normal menses History of uterine fibroids History of pelvic/ cervical/ vaginal pain with onset of menses since birth of son  P:   Reviewed health and wellness pertinent to exam  Pap smear as above  Will send urine C&S and follow  Counseled on breast self exam, STD prevention, HIV risk factors and prevention, adequate intake of calcium and vitamin D, diet and exercise return annually or prn  An After Visit Summary was printed and given to the patient.

## 2015-01-04 LAB — URINE CULTURE
COLONY COUNT: NO GROWTH
ORGANISM ID, BACTERIA: NO GROWTH

## 2015-01-06 NOTE — Progress Notes (Signed)
Encounter reviewed by Dr. Caedon Bond Amundson C. Silva.  

## 2016-01-07 ENCOUNTER — Ambulatory Visit: Payer: BLUE CROSS/BLUE SHIELD | Admitting: Nurse Practitioner

## 2016-01-19 ENCOUNTER — Encounter: Payer: Self-pay | Admitting: Nurse Practitioner

## 2016-01-19 ENCOUNTER — Ambulatory Visit (INDEPENDENT_AMBULATORY_CARE_PROVIDER_SITE_OTHER): Payer: BLUE CROSS/BLUE SHIELD | Admitting: Nurse Practitioner

## 2016-01-19 VITALS — BP 118/78 | HR 68 | Resp 16 | Ht 64.0 in | Wt 241.0 lb

## 2016-01-19 DIAGNOSIS — R8290 Unspecified abnormal findings in urine: Secondary | ICD-10-CM

## 2016-01-19 DIAGNOSIS — E559 Vitamin D deficiency, unspecified: Secondary | ICD-10-CM

## 2016-01-19 DIAGNOSIS — Z01419 Encounter for gynecological examination (general) (routine) without abnormal findings: Secondary | ICD-10-CM | POA: Diagnosis not present

## 2016-01-19 DIAGNOSIS — D649 Anemia, unspecified: Secondary | ICD-10-CM | POA: Insufficient documentation

## 2016-01-19 DIAGNOSIS — Z0000 Encounter for general adult medical examination without abnormal findings: Secondary | ICD-10-CM | POA: Diagnosis not present

## 2016-01-19 LAB — POCT URINALYSIS DIPSTICK
BILIRUBIN UA: NEGATIVE
Glucose, UA: NEGATIVE
Ketones, UA: NEGATIVE
NITRITE UA: NEGATIVE
PH UA: 8
PROTEIN UA: NEGATIVE
RBC UA: NEGATIVE
UROBILINOGEN UA: NEGATIVE

## 2016-01-19 LAB — CBC
HCT: 34.4 % — ABNORMAL LOW (ref 35.0–45.0)
Hemoglobin: 10.4 g/dL — ABNORMAL LOW (ref 11.7–15.5)
MCH: 22.3 pg — AB (ref 27.0–33.0)
MCHC: 30.2 g/dL — AB (ref 32.0–36.0)
MCV: 73.7 fL — AB (ref 80.0–100.0)
MPV: 10.1 fL (ref 7.5–12.5)
PLATELETS: 309 10*3/uL (ref 140–400)
RBC: 4.67 MIL/uL (ref 3.80–5.10)
RDW: 17.1 % — ABNORMAL HIGH (ref 11.0–15.0)
WBC: 10.8 10*3/uL (ref 3.8–10.8)

## 2016-01-19 LAB — LIPID PANEL
CHOLESTEROL: 171 mg/dL (ref <200)
HDL: 31 mg/dL — ABNORMAL LOW (ref >50)
LDL Cholesterol: 126 mg/dL — ABNORMAL HIGH (ref <100)
TRIGLYCERIDES: 69 mg/dL (ref <150)
Total CHOL/HDL Ratio: 5.5 Ratio — ABNORMAL HIGH (ref <5.0)
VLDL: 14 mg/dL (ref <30)

## 2016-01-19 LAB — COMPREHENSIVE METABOLIC PANEL
ALK PHOS: 78 U/L (ref 33–115)
ALT: 17 U/L (ref 6–29)
AST: 14 U/L (ref 10–30)
Albumin: 3.8 g/dL (ref 3.6–5.1)
BUN: 18 mg/dL (ref 7–25)
CO2: 28 mmol/L (ref 20–31)
CREATININE: 0.66 mg/dL (ref 0.50–1.10)
Calcium: 9 mg/dL (ref 8.6–10.2)
Chloride: 106 mmol/L (ref 98–110)
GLUCOSE: 81 mg/dL (ref 65–99)
Potassium: 4.1 mmol/L (ref 3.5–5.3)
SODIUM: 140 mmol/L (ref 135–146)
TOTAL PROTEIN: 6.8 g/dL (ref 6.1–8.1)
Total Bilirubin: 0.2 mg/dL (ref 0.2–1.2)

## 2016-01-19 LAB — TSH: TSH: 0.58 m[IU]/L

## 2016-01-19 LAB — HEMOGLOBIN, FINGERSTICK: HEMOGLOBIN, FINGERSTICK: 18.5 g/dL — AB (ref 12.0–16.0)

## 2016-01-19 NOTE — Progress Notes (Signed)
Patient ID: Kelly Cox, female   DOB: 1980/03/13, 35 y.o.   MRN: [id number]  35 y.o. G1P1001 Single  African American Fe here for annual exam.  Menses now at 3 days, heavy to moderate.  less cramps.  No PMS.  Not dating or SA.  Son is now 44 yrs old.   Patient's last menstrual period was 12/31/2015.          Sexually active: No.  The current method of family planning is none.    Exercising: Yes.    walking - recently started going to gym Smoker:  no  Health Maintenance: Pap:01/01/14, Negative with neg HR HPV TDaP: 11/10/2011 HIV: 07/02/11 Labs: blood drawn today  Urine: 1+ WBC   reports that she has never smoked. She has never used smokeless tobacco. She reports that she does not drink alcohol or use drugs.  Past Medical History:  Diagnosis Date  . Allergy   . Anemia   . Presence of partial dental prosthetic device     Past Surgical History:  Procedure Laterality Date  . right knee surgery  11-12 years ago   arthoscopic surgery for meniscal tear, Dr. Marlou Sa  . TOOTH EXTRACTION      No current outpatient prescriptions on file.   No current facility-administered medications for this visit.     Family History  Problem Relation Age of Onset  . Diabetes Mother   . Other Father     unknown  . Anxiety disorder Sister   . Diabetes Sister   . Hypertension Sister   . Breast cancer Maternal Aunt     breast late 30's  . Cancer Maternal Grandmother     pancreatic  . Heart disease Neg Hx   . Stroke Neg Hx     ROS:  Pertinent items are noted in HPI.  Otherwise, a comprehensive ROS was negative.  Exam:   BP 118/78 (BP Location: Right Arm, Patient Position: Sitting, Cuff Size: Large)   Pulse 68   Resp 16   Ht 5\' 4"  (1.626 m)   Wt 241 lb (109.3 kg)   LMP 12/31/2015   BMI 41.37 kg/m  Height: 5\' 4"  (162.6 cm) Ht Readings from Last 3 Encounters:  01/19/16 5\' 4"  (1.626 m)  01/03/15 5' 4.5" (1.638 m)  11/27/14 5\' 6"  (1.676 m)    General appearance: alert, cooperative and  appears stated age Head: Normocephalic, without obvious abnormality, atraumatic Neck: no adenopathy, supple, symmetrical, trachea midline and thyroid normal to inspection and palpation Lungs: clear to auscultation bilaterally Breasts: normal appearance, no masses or tenderness Heart: regular rate and rhythm Abdomen: soft, non-tender; no masses,  no organomegaly Extremities: extremities normal, atraumatic, no cyanosis or edema Skin: Skin color, texture, turgor normal. No rashes or lesions Lymph nodes: Cervical, supraclavicular, and axillary nodes normal. No abnormal inguinal nodes palpated Neurologic: Grossly normal   Pelvic: External genitalia:  no lesions              Urethra:  normal appearing urethra with no masses, tenderness or lesions              Bartholin's and Skene's: normal                 Vagina: normal appearing vagina with normal color and discharge, no lesions              Cervix: anteverted              Pap taken: No. Bimanual Exam:  Uterus:  normal size, contour, position, consistency, mobility, non-tender              Adnexa: no mass, fullness, tenderness               Rectovaginal: Confirms               Anus:  normal sphincter tone, no lesions  Chaperone present: yes  A:  Well Woman with normal exam    Not SA X 4 years  Normal menses History of uterine fibroids History of pelvic/ cervical/ vaginal pain with onset of menses since birth of son - better since last year    P:   Reviewed health and wellness pertinent to exam  Pap smear as above  Will follow with labs  Counseled on breast self exam, STD prevention, HIV risk factors and prevention, adequate intake of calcium and vitamin D, diet and exercise, Kegel's exercises return annually or prn  An After Visit Summary was printed and given to the patient.

## 2016-01-19 NOTE — Patient Instructions (Signed)

## 2016-01-20 LAB — HEMOGLOBIN A1C
HEMOGLOBIN A1C: 5.6 % (ref <5.7)
MEAN PLASMA GLUCOSE: 114 mg/dL

## 2016-01-20 LAB — URINE CULTURE: Organism ID, Bacteria: NO GROWTH

## 2016-01-20 LAB — VITAMIN D 25 HYDROXY (VIT D DEFICIENCY, FRACTURES): Vit D, 25-Hydroxy: 18 ng/mL — ABNORMAL LOW (ref 30–100)

## 2016-01-21 MED ORDER — VITAMIN D (ERGOCALCIFEROL) 1.25 MG (50000 UNIT) PO CAPS: 50000.0000 [IU] | ORAL_CAPSULE | ORAL | 0 refills | Status: DC

## 2016-01-21 NOTE — Progress Notes (Signed)
Reviewed personally.  M. Suzanne Maycee Blasco, MD.  

## 2016-01-21 NOTE — Addendum Note (Signed)
Addended by: Graylon Good on: 01/21/2016 03:51 PM   Modules accepted: Orders

## 2016-04-23 ENCOUNTER — Other Ambulatory Visit: Payer: Self-pay

## 2016-05-27 ENCOUNTER — Telehealth: Payer: Self-pay | Admitting: *Deleted

## 2016-05-27 NOTE — Telephone Encounter (Signed)
I spoke with the patient regarding her last results and her concerns with insurance coverage.  Reviewed last results and the importance of making sure Vitamin D levels are rising.  Advised patient she may reach out to PCP if insurance may pay better at their office.  Encouraged to call insurance company to ask about coverage for this test.  Again, stated to patient importance of following up on these results.  Patient voices good understanding and agreement.  Routing to provider for final review.  Closing encounter.

## 2016-05-27 NOTE — Telephone Encounter (Signed)
-----   Message from Kem Boroughs, [place] sent at 05/26/2016  5:02 PM EDT ----- She can get this done at PCP and hopefully better coverage with then ----- Message ----- From: Graylon Good, CMA Sent: 05/26/2016   2:38 PM To: Kem Boroughs, FNP  Patient canceled appointment on 04/23/16 stating her insurance does not cover this test. Please advise. steph   ----- Message ----- From: Kem Boroughs, FNP Sent: 05/25/2016   5:07 PM To: Graylon Good, CMA  Please call pt or letter about Vit D ----- Message ----- From: SYSTEM Sent: 05/25/2016  12:06 AM To: Kem Boroughs, FNP

## 2017-01-24 ENCOUNTER — Ambulatory Visit: Payer: BLUE CROSS/BLUE SHIELD | Admitting: Nurse Practitioner

## 2017-01-26 ENCOUNTER — Ambulatory Visit: Payer: BLUE CROSS/BLUE SHIELD | Admitting: Obstetrics and Gynecology

## 2017-03-17 NOTE — Progress Notes (Deleted)
37 y.o. G1P1001 Single African American female here for annual exam.    PCP:     No LMP recorded.           Sexually active: {yes no:314532}  The current method of family planning is {contraception:315051}.    Exercising: {yes no:314532}  {types:19826} Smoker:  {YES NO:22349}  Health Maintenance: Pap:  *** History of abnormal Pap:  {YES NO:22349} MMG:  *** Colonoscopy:  *** BMD:   ***  Result  *** TDaP:  *** Gardasil:   {YES NO:22349} HIV: Hep C: Screening Labs:  Hb today: ***, Urine today: ***   reports that  has never smoked. she has never used smokeless tobacco. She reports that she does not drink alcohol or use drugs.  Past Medical History:  Diagnosis Date  . Allergy   . Anemia   . Presence of partial dental prosthetic device     Past Surgical History:  Procedure Laterality Date  . right knee surgery  11-12 years ago   arthoscopic surgery for meniscal tear, Dr. Marlou Sa  . TOOTH EXTRACTION      Current Outpatient Medications  Medication Sig Dispense Refill  . Vitamin D, Ergocalciferol, (DRISDOL) 50000 units CAPS capsule Take 1 capsule (50,000 Units total) by mouth every 7 (seven) days. 30 capsule 0   No current facility-administered medications for this visit.     Family History  Problem Relation Age of Onset  . Diabetes Mother   . Other Father        unknown  . Anxiety disorder Sister   . Diabetes Sister   . Hypertension Sister   . Breast cancer Maternal Aunt        breast late 30's  . Cancer Maternal Grandmother        pancreatic  . Heart disease Neg Hx   . Stroke Neg Hx     ROS:  Pertinent items are noted in HPI.  Otherwise, a comprehensive ROS was negative.  Exam:   There were no vitals taken for this visit.    General appearance: alert, cooperative and appears stated age Head: Normocephalic, without obvious abnormality, atraumatic Neck: no adenopathy, supple, symmetrical, trachea midline and thyroid normal to inspection and palpation Lungs:  clear to auscultation bilaterally Breasts: normal appearance, no masses or tenderness, No nipple retraction or dimpling, No nipple discharge or bleeding, No axillary or supraclavicular adenopathy Heart: regular rate and rhythm Abdomen: soft, non-tender; no masses, no organomegaly Extremities: extremities normal, atraumatic, no cyanosis or edema Skin: Skin color, texture, turgor normal. No rashes or lesions Lymph nodes: Cervical, supraclavicular, and axillary nodes normal. No abnormal inguinal nodes palpated Neurologic: Grossly normal  Pelvic: External genitalia:  no lesions              Urethra:  normal appearing urethra with no masses, tenderness or lesions              Bartholins and Skenes: normal                 Vagina: normal appearing vagina with normal color and discharge, no lesions              Cervix: no lesions              Pap taken: {yes no:314532} Bimanual Exam:  Uterus:  normal size, contour, position, consistency, mobility, non-tender              Adnexa: no mass, fullness, tenderness  Rectal exam: {yes no:314532}.  Confirms.              Anus:  normal sphincter tone, no lesions  Chaperone was present for exam.  Assessment:   Well woman visit with normal exam.   Plan: Mammogram screening discussed. Recommended self breast awareness. Pap and HR HPV as above. Guidelines for Calcium, Vitamin D, regular exercise program including cardiovascular and weight bearing exercise.   Follow up annually and prn.   Additional counseling given.  {yes [id number]. _______ minutes face to face time of which over 50% was spent in counseling.    After visit summary provided.

## 2017-03-21 ENCOUNTER — Ambulatory Visit: Payer: BLUE CROSS/BLUE SHIELD | Admitting: Obstetrics and Gynecology

## 2017-04-06 ENCOUNTER — Other Ambulatory Visit (HOSPITAL_COMMUNITY)
Admission: RE | Admit: 2017-04-06 | Discharge: 2017-04-06 | Disposition: A | Discharge status: Home / Self Care | Payer: BLUE CROSS/BLUE SHIELD | Source: Ambulatory Visit | Attending: Obstetrics and Gynecology | Admitting: Obstetrics and Gynecology

## 2017-04-06 ENCOUNTER — Other Ambulatory Visit: Payer: Self-pay

## 2017-04-06 ENCOUNTER — Ambulatory Visit: Payer: BLUE CROSS/BLUE SHIELD | Admitting: Obstetrics and Gynecology

## 2017-04-06 ENCOUNTER — Encounter: Payer: Self-pay | Admitting: Obstetrics and Gynecology

## 2017-04-06 VITALS — BP 138/76 | HR 84 | Resp 16 | Ht 64.5 in | Wt 250.0 lb

## 2017-04-06 DIAGNOSIS — Z124 Encounter for screening for malignant neoplasm of cervix: Secondary | ICD-10-CM

## 2017-04-06 DIAGNOSIS — N870 Mild cervical dysplasia: Secondary | ICD-10-CM | POA: Diagnosis not present

## 2017-04-06 DIAGNOSIS — Z862 Personal history of diseases of the blood and blood-forming organs and certain disorders involving the immune mechanism: Secondary | ICD-10-CM

## 2017-04-06 DIAGNOSIS — R87810 Cervical high risk human papillomavirus (HPV) DNA test positive: Secondary | ICD-10-CM | POA: Diagnosis not present

## 2017-04-06 DIAGNOSIS — R87612 Low grade squamous intraepithelial lesion on cytologic smear of cervix (LGSIL): Secondary | ICD-10-CM | POA: Diagnosis not present

## 2017-04-06 DIAGNOSIS — Z0000 Encounter for general adult medical examination without abnormal findings: Secondary | ICD-10-CM | POA: Diagnosis not present

## 2017-04-06 DIAGNOSIS — Z01419 Encounter for gynecological examination (general) (routine) without abnormal findings: Secondary | ICD-10-CM

## 2017-04-06 DIAGNOSIS — Z6841 Body Mass Index (BMI) 40.0 and over, adult: Secondary | ICD-10-CM

## 2017-04-06 DIAGNOSIS — Z833 Family history of diabetes mellitus: Secondary | ICD-10-CM

## 2017-04-06 NOTE — Patient Instructions (Addendum)
I would recommend a mediterranean diet.  A mediterranean diet is high in fruits, vegetables, whole grains, fish, chicken, nuts, healthy fats (olive oil or canola oil). Low fat dairy. Limit butter, margarine, red meat and sweets.   EXERCISE AND DIET:  We recommended that you start or continue a regular exercise program for good health. Regular exercise means any activity that makes your heart beat faster and makes you sweat.  We recommend exercising at least 30 minutes per day at least 3 days a week, preferably 4 or 5.  We also recommend a diet low in fat and sugar.  Inactivity, poor dietary choices and obesity can cause diabetes, heart attack, stroke, and kidney damage, among others.    ALCOHOL AND SMOKING:  Women should limit their alcohol intake to no more than 7 drinks/beers/glasses of wine (combined, not each!) per week. Moderation of alcohol intake to this level decreases your risk of breast cancer and liver damage. And of course, no recreational drugs are part of a healthy lifestyle.  And absolutely no smoking or even second hand smoke. Most people know smoking can cause heart and lung diseases, but did you know it also contributes to weakening of your bones? Aging of your skin?  Yellowing of your teeth and nails?  CALCIUM AND VITAMIN D:  Adequate intake of calcium and Vitamin D are recommended.  The recommendations for exact amounts of these supplements seem to change often, but generally speaking 600 mg of calcium (either carbonate or citrate) and 800 units of Vitamin D per day seems prudent. Certain women may benefit from higher intake of Vitamin D.  If you are among these women, your doctor will have told you during your visit.    PAP SMEARS:  Pap smears, to check for cervical cancer or precancers,  have traditionally been done yearly, although recent scientific advances have shown that most women can have pap smears less often.  However, every woman still should have a physical exam from her  gynecologist every year. It will include a breast check, inspection of the vulva and vagina to check for abnormal growths or skin changes, a visual exam of the cervix, and then an exam to evaluate the size and shape of the uterus and ovaries.  And after 39 years of age, a rectal exam is indicated to check for rectal cancers. We will also provide age appropriate advice regarding health maintenance, like when you should have certain vaccines, screening for sexually transmitted diseases, bone density testing, colonoscopy, mammograms, etc.   MAMMOGRAMS:  All women over 53 years old should have a yearly mammogram. Many facilities now offer a "3D" mammogram, which may cost around $50 extra out of pocket. If possible,  we recommend you accept the option to have the 3D mammogram performed.  It both reduces the number of women who will be called back for extra views which then turn out to be normal, and it is better than the routine mammogram at detecting truly abnormal areas.    COLONOSCOPY:  Colonoscopy to screen for colon cancer is recommended for all women at age 78.  We know, you hate the idea of the prep.  We agree, BUT, having colon cancer and not knowing it is worse!!  Colon cancer so often starts as a polyp that can be seen and removed at colonscopy, which can quite literally save your life!  And if your first colonoscopy is normal and you have no family history of colon cancer, most women don't have  to have it again for 10 years.  Once every ten years, you can do something that may end up saving your life, right?  We will be happy to help you get it scheduled when you are ready.  Be sure to check your insurance coverage so you understand how much it will cost.  It may be covered as a preventative service at no cost, but you should check your particular policy.

## 2017-04-06 NOTE — Progress Notes (Signed)
37 y.o. G1P1001 SingleAfrican AmericanF here for annual exam.  Not sexually active for years.  Period Cycle (Days): 28 Period Duration (Days): 3-4 days  Period Pattern: Regular Menstrual Flow: Heavy Menstrual Control: Maxi pad, Thin pad Menstrual Control Change Freq (Hours): changes pad every 2-3 hours  Dysmenorrhea: (!) Moderate Dysmenorrhea Symptoms: Cramping  Pads are not full when she changes them.  She has been anemic for over 10 years, not sure it was ever evaluated.   Patient's last menstrual period was 03/20/2017.          Sexually active: No.  The current method of family planning is none.    Exercising: No.  The patient does not participate in regular exercise at present. Smoker:  no  Health Maintenance: Pap:  01-01-14 WNL NEG HR HPV History of abnormal Pap:  no MMG:  Never Colonoscopy:  Never BMD:   Never TDaP:  11-10-11 Gardasil: No   reports that  has never smoked. she has never used smokeless tobacco. She reports that she does not drink alcohol or use drugs. She works in [employer]. Son is 5, Leory Plowman   Past Medical History:  Diagnosis Date  . Allergy   . Anemia   . Presence of partial dental prosthetic device     Past Surgical History:  Procedure Laterality Date  . right knee surgery  11-12 years ago   arthoscopic surgery for meniscal tear, Dr. Marlou Sa  . TOOTH EXTRACTION      No current outpatient medications on file.   No current facility-administered medications for this visit.     Family History  Problem Relation Age of Onset  . Diabetes Mother   . Other Father        unknown  . Anxiety disorder Sister   . Diabetes Sister   . Hypertension Sister   . Breast cancer Maternal Aunt        breast late 30's  . Cancer Maternal Grandmother        pancreatic  . Heart disease Neg Hx   . Stroke Neg Hx     Review of Systems  Constitutional: Negative.   HENT: Negative.   Eyes: Negative.   Respiratory: Negative.   Cardiovascular: Negative.    Gastrointestinal: Negative.   Endocrine: Negative.   Genitourinary: Negative.   Musculoskeletal: Negative.   Skin:       Hair loss  Allergic/Immunologic: Negative.   Neurological: Negative.   Psychiatric/Behavioral: Negative.   She has one spot of hair loss on the back of her head.   Exam:   BP 138/76 (BP Location: Right Arm, Patient Position: Sitting, Cuff Size: Large)   Pulse 84   Resp 16   Ht 5' 4.5" (1.638 m)   Wt 250 lb (113.4 kg)   LMP 03/20/2017   BMI 42.25 kg/m   Weight change: @WEIGHTCHANGE @ Height:   Height: 5' 4.5" (163.8 cm)  Ht Readings from Last 3 Encounters:  04/06/17 5' 4.5" (1.638 m)  01/19/16 5\' 4"  (1.626 m)  01/03/15 5' 4.5" (1.638 m)    General appearance: alert, cooperative and appears stated age Head: Normocephalic, without obvious abnormality, atraumatic Neck: no adenopathy, supple, symmetrical, trachea midline and thyroid normal to inspection and palpation Lungs: clear to auscultation bilaterally Cardiovascular: regular rate and rhythm Breasts: normal appearance, no masses or tenderness Abdomen: soft, non-tender; non distended,  no masses,  no organomegaly Extremities: extremities normal, atraumatic, no cyanosis or edema Skin: Skin color, texture, turgor normal. No rashes. She has a raised  black lesion on her right inner forearm, ~5 mm. Lymph nodes: Cervical, supraclavicular, and axillary nodes normal. No abnormal inguinal nodes palpated Neurologic: Grossly normal   Pelvic: External genitalia:  no lesions              Urethra:  normal appearing urethra with no masses, tenderness or lesions              Bartholins and Skenes: normal                 Vagina: normal appearing vagina with normal color and discharge, no lesions              Cervix: no lesions               Bimanual Exam:  Uterus:  normal size, contour, position, consistency, mobility, non-tender              Adnexa: no mass, fullness, tenderness               Rectovaginal:  Confirms               Anus:  normal sphincter tone, no lesions  Chaperone was present for exam.  A:  Well Woman with normal exam  Overweight, BMI 42  Lesion on her arm, recommended she f/u with derm, #'s given  H/O anemia, doesn't think it was ever evaluated. Bleeding doesn't sound heavy enough to cause anemia  P:   Pap with hpv  Discussed breast self exam  Discussed calcium and vit D intake  Screening labs, TSH, HgbA1C, iron studies  Discussed weight loss

## 2017-04-07 LAB — LIPID PANEL
CHOLESTEROL TOTAL: 187 mg/dL (ref 100–199)
Chol/HDL Ratio: 5.1 ratio — ABNORMAL HIGH (ref 0.0–4.4)
HDL: 37 mg/dL — ABNORMAL LOW (ref >39)
LDL Calculated: 132 mg/dL — ABNORMAL HIGH (ref 0–99)
TRIGLYCERIDES: 88 mg/dL (ref 0–149)
VLDL CHOLESTEROL CAL: 18 mg/dL (ref 5–40)

## 2017-04-07 LAB — COMPREHENSIVE METABOLIC PANEL
ALT: 17 IU/L (ref 0–32)
AST: 14 IU/L (ref 0–40)
Albumin/Globulin Ratio: 1.2 (ref 1.2–2.2)
Albumin: 4 g/dL (ref 3.5–5.5)
Alkaline Phosphatase: 107 IU/L (ref 39–117)
BUN/Creatinine Ratio: 24 — ABNORMAL HIGH (ref 9–23)
BUN: 15 mg/dL (ref 6–20)
Bilirubin Total: <0.2 mg/dL (ref 0.0–1.2)
CALCIUM: 9.3 mg/dL (ref 8.7–10.2)
CO2: 23 mmol/L (ref 20–29)
CREATININE: 0.63 mg/dL (ref 0.57–1.00)
Chloride: 103 mmol/L (ref 96–106)
GFR, EST AFRICAN AMERICAN: 132 mL/min/{1.73_m2} (ref >59)
GFR, EST NON AFRICAN AMERICAN: 114 mL/min/{1.73_m2} (ref >59)
GLUCOSE: 94 mg/dL (ref 65–99)
Globulin, Total: 3.4 g/dL (ref 1.5–4.5)
Potassium: 4.5 mmol/L (ref 3.5–5.2)
Sodium: 139 mmol/L (ref 134–144)
TOTAL PROTEIN: 7.4 g/dL (ref 6.0–8.5)

## 2017-04-07 LAB — CBC WITH DIFFERENTIAL/PLATELET
BASOS ABS: 0 10*3/uL (ref 0.0–0.2)
BASOS: 0 %
EOS (ABSOLUTE): 0.2 10*3/uL (ref 0.0–0.4)
Eos: 2 %
Hematocrit: 36 % (ref 34.0–46.6)
Hemoglobin: 11.3 g/dL (ref 11.1–15.9)
IMMATURE GRANS (ABS): 0 10*3/uL (ref 0.0–0.1)
IMMATURE GRANULOCYTES: 0 %
LYMPHS: 41 %
Lymphocytes Absolute: 3.5 10*3/uL — ABNORMAL HIGH (ref 0.7–3.1)
MCH: 22.6 pg — ABNORMAL LOW (ref 26.6–33.0)
MCHC: 31.4 g/dL — ABNORMAL LOW (ref 31.5–35.7)
MCV: 72 fL — ABNORMAL LOW (ref 79–97)
Monocytes Absolute: 0.8 10*3/uL (ref 0.1–0.9)
Monocytes: 9 %
NEUTROS PCT: 48 %
Neutrophils Absolute: 4 10*3/uL (ref 1.4–7.0)
Platelets: 316 10*3/uL (ref 150–379)
RBC: 5 x10E6/uL (ref 3.77–5.28)
RDW: 17.5 % — ABNORMAL HIGH (ref 12.3–15.4)
WBC: 8.5 10*3/uL (ref 3.4–10.8)

## 2017-04-07 LAB — HEMOGLOBIN A1C
Est. average glucose Bld gHb Est-mCnc: 123 mg/dL
Hgb A1c MFr Bld: 5.9 % — ABNORMAL HIGH (ref 4.8–5.6)

## 2017-04-07 LAB — IRON AND TIBC
IRON: 23 ug/dL — AB (ref 27–159)
Iron Saturation: 7 % — CL (ref 15–55)
Total Iron Binding Capacity: 317 ug/dL (ref 250–450)
UIBC: 294 ug/dL (ref 131–425)

## 2017-04-07 LAB — B12 AND FOLATE PANEL
FOLATE: 10.8 ng/mL (ref >3.0)
Vitamin B-12: 501 pg/mL (ref 232–1245)

## 2017-04-07 LAB — FERRITIN: Ferritin: 61 ng/mL (ref 15–150)

## 2017-04-07 LAB — TSH: TSH: 1.05 u[IU]/mL (ref 0.450–4.500)

## 2017-04-08 LAB — CYTOLOGY - PAP: HPV: DETECTED — AB

## 2017-04-12 ENCOUNTER — Telehealth: Payer: Self-pay | Admitting: *Deleted

## 2017-04-12 DIAGNOSIS — R87612 Low grade squamous intraepithelial lesion on cytologic smear of cervix (LGSIL): Secondary | ICD-10-CM

## 2017-04-12 DIAGNOSIS — B977 Papillomavirus as the cause of diseases classified elsewhere: Secondary | ICD-10-CM

## 2017-04-12 NOTE — Telephone Encounter (Signed)
Notes recorded by Burnice Logan, RN on 04/12/2017 at 9:57 AM EST Left message to call Sharee Pimple at [phone].  See 04/06/17 pap result

## 2017-04-12 NOTE — Telephone Encounter (Signed)
-----   Message from Salvadore Dom, MD sent at 04/08/2017  6:19 PM EST ----- Please inform and set her up for a colposcopy

## 2017-04-20 NOTE — Telephone Encounter (Signed)
Spoke with patient, advised of 04/06/17 abnormal pap result, colposcopy recommended for further evaluation.   LMP 04/15/17 Contraceptive, abstinence. States she has not been SA in 5 yrs.   Brief explanation of colpo provided and questions answered. [place] scheduled for 04/28/17 at 1pm with Dr. Talbert Nan. Patient declined earlier dates offered. Advised to continue to abstain from intercourse until after colpo. Motrin 800 mg with food and water one hour before procedure..   Advised patient colpo typically scheduled before day 12 of cycle to ensure no chance of pregnancy, need to review with Dr. Talbert Nan, will return call with any additional recommendations. Patient verbalizes understanding and is agreeable.  Order placed for colpo.  Dr. Talbert Nan -ok to proceed with colpo as scheduled?   Cc: Lerry Liner

## 2017-04-20 NOTE — Telephone Encounter (Signed)
Left detailed message, ok per current dpr. Advised will keep colpo as scheduled for 2/28 at 1pm. Return call to office should menses start again and bleeding is heavy, would need to reschedule. Return call to office with any additional questions/concerns.   Routing to provider for final review.  Will close encounter.

## 2017-04-20 NOTE — Telephone Encounter (Signed)
She can have the colposcopy at any time in her cycle as long as she isn't on her menses (okay if spotting)

## 2017-04-25 ENCOUNTER — Telehealth: Payer: Self-pay | Admitting: Obstetrics and Gynecology

## 2017-04-25 NOTE — Telephone Encounter (Signed)
Spoke with patient, request to reschedule colpo to later time.   LMP 04/15/17 Not SA; Contraceptive, abstinence  Colpo rescheduled for 05/02/17 at 3pm with Dr. Talbert Nan. Advised to continue to abstain from intercourse until after colpo. Patient verbalizes understanding and is agreeable.   Routing to provider for final review. Patient is agreeable to disposition. Will close encounter.

## 2017-04-25 NOTE — Telephone Encounter (Signed)
Patient called and cancelled her colposcopy appointment on 04/28/17. She can only come after 2:30 PM that day.

## 2017-04-28 ENCOUNTER — Ambulatory Visit: Payer: Self-pay | Admitting: Obstetrics and Gynecology

## 2017-05-02 ENCOUNTER — Ambulatory Visit (INDEPENDENT_AMBULATORY_CARE_PROVIDER_SITE_OTHER): Payer: BLUE CROSS/BLUE SHIELD | Admitting: Obstetrics and Gynecology

## 2017-05-02 ENCOUNTER — Other Ambulatory Visit: Payer: Self-pay

## 2017-05-02 ENCOUNTER — Encounter: Payer: Self-pay | Admitting: Obstetrics and Gynecology

## 2017-05-02 VITALS — BP 122/72 | HR 84 | Resp 16 | Wt 252.0 lb

## 2017-05-02 DIAGNOSIS — R87612 Low grade squamous intraepithelial lesion on cytologic smear of cervix (LGSIL): Secondary | ICD-10-CM

## 2017-05-02 DIAGNOSIS — B977 Papillomavirus as the cause of diseases classified elsewhere: Secondary | ICD-10-CM | POA: Diagnosis not present

## 2017-05-02 DIAGNOSIS — N870 Mild cervical dysplasia: Secondary | ICD-10-CM | POA: Diagnosis not present

## 2017-05-02 DIAGNOSIS — Z01812 Encounter for preprocedural laboratory examination: Secondary | ICD-10-CM | POA: Diagnosis not present

## 2017-05-02 LAB — POCT URINE PREGNANCY: Preg Test, Ur: NEGATIVE

## 2017-05-02 NOTE — Progress Notes (Signed)
GYNECOLOGY  VISIT   HPI: 37 y.o.   Single  African American  female   Kelly Cox with Patient's last menstrual period was 04/15/2017.   here for  A colposcopy. Recent pap returned as LSIL, +HPV. No prior h/o abnormal paps (last pap in 11/15)  GYNECOLOGIC HISTORY: Patient's last menstrual period was 04/15/2017. Contraception:none  Menopausal hormone therapy: none         OB History    Gravida Para Term Preterm AB Living   1 1 1  0 0 1   SAB TAB Ectopic Multiple Live Births   0 0 0 0 1         Patient Active Problem List   Diagnosis Date Noted  . Anemia     Past Medical History:  Diagnosis Date  . Allergy   . Anemia   . Presence of partial dental prosthetic device     Past Surgical History:  Procedure Laterality Date  . right knee surgery  11-12 years ago   arthoscopic surgery for meniscal tear, Dr. Marlou Sa  . TOOTH EXTRACTION      No current outpatient medications on file.   No current facility-administered medications for this visit.      ALLERGIES: Patient has no known allergies.  Family History  Problem Relation Age of Onset  . Diabetes Mother   . Other Father        unknown  . Anxiety disorder Sister   . Diabetes Sister   . Hypertension Sister   . Breast cancer Maternal Aunt        breast late 30's  . Cancer Maternal Grandmother        pancreatic  . Heart disease Neg Hx   . Stroke Neg Hx     Social History   Socioeconomic History  . Marital status: Single    Spouse name: Not on file  . Number of children: Not on file  . Years of education: Not on file  . Highest education level: Not on file  Social Needs  . Financial resource strain: Not on file  . Food insecurity - worry: Not on file  . Food insecurity - inability: Not on file  . Transportation needs - medical: Not on file  . Transportation needs - non-medical: Not on file  Occupational History  . Not on file  Tobacco Use  . Smoking status: Never Smoker  . Smokeless tobacco: Never Used   Substance and Sexual Activity  . Alcohol use: No    Alcohol/week: 0.0 oz  . Drug use: No  . Sexual activity: Not Currently    Partners: Male    Birth control/protection: None  Other Topics Concern  . Not on file  Social History Narrative   Single, has 2yo son, works at [employer] in [employer], exercise with walking, involved in [place].    Review of Systems  Constitutional: Negative.   HENT: Negative.   Eyes: Negative.   Respiratory: Negative.   Cardiovascular: Negative.   Gastrointestinal: Negative.   Genitourinary: Negative.   Musculoskeletal: Negative.   Skin: Negative.   Neurological: Negative.   Endo/Heme/Allergies: Negative.   Psychiatric/Behavioral: Negative.     PHYSICAL EXAMINATION:    BP 122/72 (BP Location: Right Arm, Patient Position: Sitting, Cuff Size: Large)   Pulse 84   Resp 16   Wt 252 lb (114.3 kg)   LMP 04/15/2017   BMI 42.59 kg/m     General appearance: alert, cooperative and appears stated age   Pelvic: External  genitalia:  no lesions              Urethra:  normal appearing urethra with no masses, tenderness or lesions              Bartholins and Skenes: normal                 Vagina: normal appearing vagina with normal color and discharge, no lesions              Cervix: no gross lesions  Colposcopy: satisfactory, mild aceto-white changes at 1 o'clock, biopsy done, ECC done. Biopsy site treated with silver nitrate. Negative lugols examination of the upper vagina.  Chaperone was present for exam.  ASSESSMENT LSIL, +HPV. Discussed hpv, abnormal paps, dysplasia, need for f/u and possible treatment    PLAN Colposcopy with biopsy and ECC Further plans depending on results   An After Visit Summary was printed and given to the patient.

## 2017-05-02 NOTE — Patient Instructions (Signed)

## 2017-05-06 ENCOUNTER — Telehealth: Payer: Self-pay

## 2017-05-06 DIAGNOSIS — N871 Moderate cervical dysplasia: Secondary | ICD-10-CM

## 2017-05-06 NOTE — Telephone Encounter (Signed)
-----   Message from Salvadore Dom, MD sent at 05/05/2017  5:31 PM EST ----- Please inform the patient that her biopsy returned with CIN II and set her up for a leep

## 2017-05-06 NOTE — Telephone Encounter (Signed)
Spoke with patient. Advised of message as seen below from [place]. Patient does not wish to proceed with scheduling at this time due to benefits. States she will return call next week to schedule. Order placed for precert.

## 2017-05-10 NOTE — Telephone Encounter (Signed)
LMOM to convey benefits//rld

## 2017-05-19 NOTE — Telephone Encounter (Signed)
Patient has not returned call to schedule her LEEP. Please contact patient again Thanks!!

## 2017-05-20 NOTE — Telephone Encounter (Signed)
Routing to [employer] and Lamont Snowball for advise.

## 2017-05-20 NOTE — Telephone Encounter (Signed)
Routing to [employer]  for benefits. Patient does not wish to proceed until discussing these.

## 2017-05-20 NOTE — Telephone Encounter (Signed)
I have already conveyed benefits to the patient. She did not want to proceed yet with the referral.

## 2017-06-01 ENCOUNTER — Encounter: Payer: Self-pay | Admitting: *Deleted

## 2017-06-01 NOTE — Telephone Encounter (Signed)
Letter to your office for review. 

## 2017-06-01 NOTE — Telephone Encounter (Signed)
Letter reviewed and signed by Dr Talbert Nan. Mailed certified and regular [place] mail.  Encounter closed.

## 2017-06-06 ENCOUNTER — Telehealth: Payer: Self-pay | Admitting: Obstetrics and Gynecology

## 2017-06-06 NOTE — Telephone Encounter (Signed)
Patient says she received a letter and calling to speak with nurse.

## 2017-06-06 NOTE — Telephone Encounter (Signed)
Called into answer questions for patient regarding any possible procedures following LEEP. Advised that cannot guarantee results. All recomendations are dependent on path report. Usually, if no advanced disease and clear margins, recheck in one month and pap in one year. However, advised that this is a guideline and sometimes need more frequent paps and additional procedure. Patient wanted to consider this information and call back tomorrow.    Routing to provider for review.

## 2017-06-06 NOTE — Telephone Encounter (Signed)
Returned call to patient. Patient states she has not received letter sent so I informed patient she needs to schedule LEEP procedure within 30 days.   Payment options provided. See Account Notes. Patient had additional questions for Nurse. Questions answered by Lamont Snowball RN. Patient will call back to reschedule by end of day tomorrow 06/07/17.   Routing to Triage, cc Lamont Snowball RN

## 2017-06-10 ENCOUNTER — Other Ambulatory Visit: Payer: Self-pay | Admitting: *Deleted

## 2017-06-10 DIAGNOSIS — N871 Moderate cervical dysplasia: Secondary | ICD-10-CM

## 2017-06-10 NOTE — Telephone Encounter (Signed)
Patient returned call to schedule LEEP procedure states started period on 06/06/17 or 06/07/17. Declines to schedule until week of 06/28/17. Appointment scheduled for 06/29/17 at 9:00am. Patient states she is not using contraception but she is not sexually active. Advised she will need to abstain from intercourse and a pregnancy test will need to be completed at the time of her visit. Patient frustrated with need for pregnancy test since not sexually active. Advised this will be reviewed with provider.  Information given through Lamont Snowball, RN.   Routing to provider for final review. Patient agreeable to disposition. Will close encounter.

## 2017-06-13 NOTE — Telephone Encounter (Signed)
Please let the patient know that we do not have to do a pregnancy test on her, as long as she doesn't become sexually active again prior to her procedure. Per our last conversation she hasn't been sexually active for years. Please make a note for yourself for when she comes in.

## 2017-06-13 NOTE — Telephone Encounter (Signed)
Spoke with patient and advised that no UPT would be done at her procedure- eh

## 2017-06-29 ENCOUNTER — Other Ambulatory Visit: Payer: Self-pay

## 2017-06-29 ENCOUNTER — Encounter: Payer: Self-pay | Admitting: Obstetrics and Gynecology

## 2017-06-29 ENCOUNTER — Ambulatory Visit (INDEPENDENT_AMBULATORY_CARE_PROVIDER_SITE_OTHER): Payer: BLUE CROSS/BLUE SHIELD | Admitting: Obstetrics and Gynecology

## 2017-06-29 VITALS — BP 128/80 | HR 72 | Resp 16 | Wt 252.0 lb

## 2017-06-29 DIAGNOSIS — N871 Moderate cervical dysplasia: Secondary | ICD-10-CM | POA: Diagnosis not present

## 2017-06-29 DIAGNOSIS — Z23 Encounter for immunization: Secondary | ICD-10-CM

## 2017-06-29 NOTE — Progress Notes (Signed)
GYNECOLOGY  VISIT   HPI: 37 y.o.   Single  African American  female   [id number] with Patient's last menstrual period was 06/03/2017.   here for a LEEP for CIN II, negative ECC     GYNECOLOGIC HISTORY: Patient's last menstrual period was 06/03/2017. Contraception:none  (not sexually active) Menopausal hormone therapy: none         OB History    Gravida  1   Para  1   Term  1   Preterm  0   AB  0   Living  1     SAB  0   TAB  0   Ectopic  0   Multiple  0   Live Births  1              Patient Active Problem List   Diagnosis Date Noted  . Anemia     Past Medical History:  Diagnosis Date  . Allergy   . Anemia   . Presence of partial dental prosthetic device     Past Surgical History:  Procedure Laterality Date  . right knee surgery  11-12 years ago   arthoscopic surgery for meniscal tear, Dr. Marlou Sa  . TOOTH EXTRACTION      No current outpatient medications on file.   No current facility-administered medications for this visit.      ALLERGIES: Patient has no known allergies.  Family History  Problem Relation Age of Onset  . Diabetes Mother   . Other Father        unknown  . Anxiety disorder Sister   . Diabetes Sister   . Hypertension Sister   . Breast cancer Maternal Aunt        breast late 30's  . Cancer Maternal Grandmother        pancreatic  . Heart disease Neg Hx   . Stroke Neg Hx     Social History   Socioeconomic History  . Marital status: Single    Spouse name: Not on file  . Number of children: Not on file  . Years of education: Not on file  . Highest education level: Not on file  Occupational History  . Not on file  Social Needs  . Financial resource strain: Not on file  . Food insecurity:    Worry: Not on file    Inability: Not on file  . Transportation needs:    Medical: Not on file    Non-medical: Not on file  Tobacco Use  . Smoking status: Never Smoker  . Smokeless tobacco: Never Used  Substance and Sexual  Activity  . Alcohol use: No    Alcohol/week: 0.0 oz  . Drug use: No  . Sexual activity: Not Currently    Partners: Male    Birth control/protection: None  Lifestyle  . Physical activity:    Days per week: Not on file    Minutes per session: Not on file  . Stress: Not on file  Relationships  . Social connections:    Talks on phone: Not on file    Gets together: Not on file    Attends religious service: Not on file    Active member of club or organization: Not on file    Attends meetings of clubs or organizations: Not on file    Relationship status: Not on file  . Intimate partner violence:    Fear of current or ex partner: Not on file    Emotionally abused: Not on file  Physically abused: Not on file    Forced sexual activity: Not on file  Other Topics Concern  . Not on file  Social History Narrative   Single, has 2yo son, works at [employer] in [employer], exercise with walking, involved in [place].    Review of Systems  Constitutional: Negative.   HENT: Negative.   Eyes: Negative.   Respiratory: Negative.   Cardiovascular: Negative.   Gastrointestinal: Negative.   Genitourinary: Negative.   Musculoskeletal: Negative.   Skin: Negative.   Neurological: Negative.   Endo/Heme/Allergies: Negative.   Psychiatric/Behavioral: Negative.     PHYSICAL EXAMINATION:    BP 128/80 (BP Location: Right Arm, Patient Position: Sitting, Cuff Size: Large)   Pulse 72   Resp 16   Wt 252 lb (114.3 kg)   LMP 06/03/2017   BMI 42.59 kg/m     General appearance: alert, cooperative and appears stated age  Pelvic: External genitalia:  no lesions              Urethra:  normal appearing urethra with no masses, tenderness or lesions              Bartholins and Skenes: normal                 Vagina: normal appearing vagina with normal color and discharge, no lesions              Cervix: no gross lesions  Procedure: The patient was counseled as to the risks of the  procedure, including: infection, bleeding, future pregnancy risks and cervical stenosis. A consent form was signed.  A colposcopy was performed with acetic acid and then Lugols solution, it was satisfactory.  A paracervical block was injected using 1% lidocaine with epinephrine. Under colposcopic guidance, the 2 x 0.8 cm loop was used to remove a portion of the ectocervix taking care to get the entire transformation zone.  The settings were 55 cut, 77 coag with a blend of 1.  An ECC was performed. The cautery ball was then used to cauterize the base of the biopsy site and monsels were placed. The patient tolerated the procedure well.    Chaperone was present for exam.  ASSESSMENT CIN II, satisfactory colposcopy    PLAN LEEP performed Discussed starting the gardasil series, she will check on insurance coverage F/U in one month   An After Visit Summary was printed and given to the patient.

## 2017-06-29 NOTE — Addendum Note (Signed)
Addended by: Susanne Greenhouse E on: 06/29/2017 10:14 AM   Modules accepted: Orders

## 2017-06-29 NOTE — Patient Instructions (Signed)

## 2017-07-28 ENCOUNTER — Telehealth: Payer: Self-pay | Admitting: Obstetrics and Gynecology

## 2017-07-28 NOTE — Telephone Encounter (Signed)
Spoke with patient. Patient states that she has started spotting and expects to start her menses next week. Would like to reschedule her recheck on 6/3. Appointment rescheduled to 08/09/17 at 3:30 pm. Patient is agreeable to date and time.  Routing to provider for final review. Patient agreeable to disposition. Will close encounter.

## 2017-07-28 NOTE — Telephone Encounter (Signed)
Patient called wanting to reschedule appt for 08/01/17 due to being on her cycle. Patient needs appt later in the day and there are none available.

## 2017-08-01 ENCOUNTER — Ambulatory Visit: Payer: BLUE CROSS/BLUE SHIELD | Admitting: Obstetrics and Gynecology

## 2017-08-09 ENCOUNTER — Encounter: Payer: Self-pay | Admitting: Obstetrics and Gynecology

## 2017-08-09 ENCOUNTER — Other Ambulatory Visit: Payer: Self-pay

## 2017-08-09 ENCOUNTER — Ambulatory Visit: Payer: BLUE CROSS/BLUE SHIELD | Admitting: Obstetrics and Gynecology

## 2017-08-09 VITALS — BP 124/80 | HR 72 | Resp 16 | Wt 250.0 lb

## 2017-08-09 DIAGNOSIS — Z87410 Personal history of cervical dysplasia: Secondary | ICD-10-CM | POA: Diagnosis not present

## 2017-08-09 DIAGNOSIS — Z9889 Other specified postprocedural states: Secondary | ICD-10-CM | POA: Diagnosis not present

## 2017-08-09 NOTE — Progress Notes (Signed)
GYNECOLOGY  VISIT   HPI: 37 y.o.   Single  African American  female   [id number] with Patient's last menstrual period was 08/03/2017.   here for follow up LEEP, no c/o.      Diagnosis 1. Cervix, LEEP, biopsy -CERVICAL TRANSFORMATION ZONE MUCOSA WITH CIN-I AND CIN-II (MILD AND MODERATE SQUAMOUS DYSPLASIA; LOW AND HIGH GRADE SQUAMOUS INTRAEPITHELIAL LESION). -NO INVASIVE NEOPLASM IDENTIFIED. -MARGINS FREE OF NEOPLASM. 2. Endocervix, curettage -MUCOID MATERIAL, RED BLOOD CELLS AND SMALL AMOUNTS OF BENIGN ENDOCERVICAL MUCOSA. -NO DYSPLASIA OR MALIGNANCY IDENTIFIED. Haywood Lasso, MD  GYNECOLOGIC HISTORY: Patient's last menstrual period was 08/03/2017. Contraception:none  Menopausal hormone therapy: none         OB History    Gravida  1   Para  1   Term  1   Preterm  0   AB  0   Living  1     SAB  0   TAB  0   Ectopic  0   Multiple  0   Live Births  1              Patient Active Problem List   Diagnosis Date Noted  . Anemia     Past Medical History:  Diagnosis Date  . Allergy   . Anemia   . Presence of partial dental prosthetic device     Past Surgical History:  Procedure Laterality Date  . right knee surgery  11-12 years ago   arthoscopic surgery for meniscal tear, Dr. Marlou Sa  . TOOTH EXTRACTION      No current outpatient medications on file.   No current facility-administered medications for this visit.      ALLERGIES: Patient has no known allergies.  Family History  Problem Relation Age of Onset  . Diabetes Mother   . Other Father        unknown  . Anxiety disorder Sister   . Diabetes Sister   . Hypertension Sister   . Breast cancer Maternal Aunt        breast late 30's  . Cancer Maternal Grandmother        pancreatic  . Heart disease Neg Hx   . Stroke Neg Hx     Social History   Socioeconomic History  . Marital status: Single    Spouse name: Not on file  . Number of children: Not on file  . Years of education: Not on  file  . Highest education level: Not on file  Occupational History  . Not on file  Social Needs  . Financial resource strain: Not on file  . Food insecurity:    Worry: Not on file    Inability: Not on file  . Transportation needs:    Medical: Not on file    Non-medical: Not on file  Tobacco Use  . Smoking status: Never Smoker  . Smokeless tobacco: Never Used  Substance and Sexual Activity  . Alcohol use: No    Alcohol/week: 0.0 oz  . Drug use: No  . Sexual activity: Not Currently    Partners: Male    Birth control/protection: None  Lifestyle  . Physical activity:    Days per week: Not on file    Minutes per session: Not on file  . Stress: Not on file  Relationships  . Social connections:    Talks on phone: Not on file    Gets together: Not on file    Attends religious service: Not on file  Active member of club or organization: Not on file    Attends meetings of clubs or organizations: Not on file    Relationship status: Not on file  . Intimate partner violence:    Fear of current or ex partner: Not on file    Emotionally abused: Not on file    Physically abused: Not on file    Forced sexual activity: Not on file  Other Topics Concern  . Not on file  Social History Narrative   Single, has 2yo son, works at [employer] in [employer], exercise with walking, involved in [place].    Review of Systems  Constitutional: Negative.   HENT: Negative.   Eyes: Negative.   Respiratory: Negative.   Cardiovascular: Negative.   Gastrointestinal: Negative.   Genitourinary: Negative.   Musculoskeletal: Negative.   Skin: Negative.   Neurological: Negative.   Endo/Heme/Allergies: Negative.   Psychiatric/Behavioral: Negative.     PHYSICAL EXAMINATION:    BP 124/80 (BP Location: Right Arm, Patient Position: Sitting, Cuff Size: Normal)   Pulse 72   Resp 16   Wt 250 lb (113.4 kg)   LMP 08/03/2017   BMI 42.25 kg/m     General appearance: alert,  cooperative and appears stated age  Pelvic: External genitalia:  no lesions              Urethra:  normal appearing urethra with no masses, tenderness or lesions              Bartholins and Skenes: normal                 Vagina: normal appearing vagina with normal color and discharge, no lesions              Cervix: no lesions and well healed s/p leep              Bimanual Exam:  Uterus:  normal size, contour, position, consistency, mobility, non-tender              Adnexa: no mass, fullness, tenderness                 Chaperone was present for exam.  ASSESSMENT H/O cervical dysplasia, s/p leep. High grade dysplasia with negative margins and negative ECC    PLAN F/u for annual exam and pap/HPV in 2/20   An After Visit Summary was printed and given to the patient.

## 2017-08-29 ENCOUNTER — Ambulatory Visit: Payer: BLUE CROSS/BLUE SHIELD

## 2017-08-30 ENCOUNTER — Ambulatory Visit (INDEPENDENT_AMBULATORY_CARE_PROVIDER_SITE_OTHER): Payer: BLUE CROSS/BLUE SHIELD

## 2017-08-30 VITALS — BP 124/72 | HR 62 | Ht 64.5 in | Wt 250.0 lb

## 2017-08-30 DIAGNOSIS — Z23 Encounter for immunization: Secondary | ICD-10-CM

## 2017-08-30 NOTE — Progress Notes (Signed)
Patient in today for her second Gardasil injection.   Contraception: not sexually active LMP: 08/30/17 Last AEX: 04/06/17 with Dr. Talbert Nan Injection given in Right Deltoid. Patient tolerated shot well.   Patient informed next injection due in about 4 months.  Advised patient, if not on birth control, to return for next injection with cycle.   Routed to provider for final review.  Encounter closed.

## 2017-12-30 ENCOUNTER — Ambulatory Visit (INDEPENDENT_AMBULATORY_CARE_PROVIDER_SITE_OTHER): Payer: BLUE CROSS/BLUE SHIELD

## 2017-12-30 VITALS — BP 116/72 | HR 72 | Resp 14 | Ht 64.25 in | Wt 252.0 lb

## 2017-12-30 DIAGNOSIS — Z23 Encounter for immunization: Secondary | ICD-10-CM | POA: Diagnosis not present

## 2017-12-30 NOTE — Progress Notes (Signed)
Patient in today for 3rd Gardasil injection.   Contraception: not sexually active  LMP: 11/29/17 Last AEX: 04/16/17 with JJ  Injection given in Left Deltoid. Patient tolerated shot well.   Patient informed she is finished with the series   Advised patient, if not on birth control, to return for next injection with cycle.   Routed to provider for final review.  Encounter closed.

## 2018-01-16 DIAGNOSIS — L638 Other alopecia areata: Secondary | ICD-10-CM | POA: Diagnosis not present

## 2018-01-16 DIAGNOSIS — D2361 Other benign neoplasm of skin of right upper limb, including shoulder: Secondary | ICD-10-CM | POA: Diagnosis not present

## 2018-03-08 ENCOUNTER — Other Ambulatory Visit: Payer: Self-pay

## 2018-03-08 ENCOUNTER — Ambulatory Visit (HOSPITAL_COMMUNITY)
Admission: EM | Admit: 2018-03-08 | Discharge: 2018-03-08 | Disposition: A | Discharge status: Home / Self Care | Payer: BLUE CROSS/BLUE SHIELD | Attending: Family Medicine | Admitting: Family Medicine

## 2018-03-08 ENCOUNTER — Encounter (HOSPITAL_COMMUNITY): Payer: Self-pay

## 2018-03-08 DIAGNOSIS — J019 Acute sinusitis, unspecified: Secondary | ICD-10-CM | POA: Insufficient documentation

## 2018-03-08 LAB — POCT RAPID STREP A: Streptococcus, Group A Screen (Direct): NEGATIVE

## 2018-03-08 MED ORDER — AMOXICILLIN-POT CLAVULANATE 875-125 MG PO TABS: 1.0000 | ORAL_TABLET | Freq: Two times a day (BID) | ORAL | 0 refills | Status: AC

## 2018-03-08 NOTE — Discharge Instructions (Signed)
Strep was negative.  We will send out to culture and call you with abnormal results Get plenty of rest and push fluids You may use OTC flonase or allergy medications daily for symptomatic relief Augmentin prescribed for sinus infection.  Take as directed and to completion Follow up with PCP or with [facility] if symptoms persist Return or go to ER if you have any new or worsening symptoms fever, chills, nausea, vomiting, chest pain, cough, shortness of breath, wheezing, abdominal pain, changes in bowel or bladder habits, etc..Marland Kitchen

## 2018-03-08 NOTE — ED Provider Notes (Signed)
[facility]   [id number] 03/08/18 Arrival Time: 1700   CC: URI symptoms   SUBJECTIVE: History from: patient.  Kelly Cox is a 38 y.o. female who presents with abrupt onset of nasal congestion, runny nose, sore throat, cough x >7 days, worsening symptoms within the last few days.  Denies known sick exposure or precipitating event.  Has tried OTC advil cold/flu with temporary relief.  Symptoms are made worse at night.  Complains of associated fatigue.  Denies fever, chills, SOB, wheezing, chest pain, nausea, changes in bowel or bladder habits.     Received flu shot this year: no.  ROS: As per HPI.  Past Medical History:  Diagnosis Date  . Allergy   . Anemia   . Presence of partial dental prosthetic device    Past Surgical History:  Procedure Laterality Date  . right knee surgery  11-12 years ago   arthoscopic surgery for meniscal tear, Dr. Marlou Sa  . TOOTH EXTRACTION     No Known Allergies No current facility-administered medications on file prior to encounter.    No current outpatient medications on file prior to encounter.   Social History   Socioeconomic History  . Marital status: Single    Spouse name: Not on file  . Number of children: Not on file  . Years of education: Not on file  . Highest education level: Not on file  Occupational History  . Not on file  Social Needs  . Financial resource strain: Not on file  . Food insecurity:    Worry: Not on file    Inability: Not on file  . Transportation needs:    Medical: Not on file    Non-medical: Not on file  Tobacco Use  . Smoking status: Never Smoker  . Smokeless tobacco: Never Used  Substance and Sexual Activity  . Alcohol use: No    Alcohol/week: 0.0 standard drinks  . Drug use: No  . Sexual activity: Not Currently    Partners: Male    Birth control/protection: None  Lifestyle  . Physical activity:    Days per week: Not on file    Minutes per session: Not on file  . Stress: Not on  file  Relationships  . Social connections:    Talks on phone: Not on file    Gets together: Not on file    Attends religious service: Not on file    Active member of club or organization: Not on file    Attends meetings of clubs or organizations: Not on file    Relationship status: Not on file  . Intimate partner violence:    Fear of current or ex partner: Not on file    Emotionally abused: Not on file    Physically abused: Not on file    Forced sexual activity: Not on file  Other Topics Concern  . Not on file  Social History Narrative   Single, has 2yo son, works at [employer] in [employer], exercise with walking, involved in [place].   Family History  Problem Relation Age of Onset  . Diabetes Mother   . Other Father        unknown  . Anxiety disorder Sister   . Diabetes Sister   . Hypertension Sister   . Breast cancer Maternal Aunt        breast late 30's  . Cancer Maternal Grandmother        pancreatic  . Heart disease Neg Hx   . Stroke  Neg Hx     OBJECTIVE:  Vitals:   03/08/18 1736 03/08/18 1738  BP:  136/90  Pulse:  78  Resp:  18  Temp:  97.9 F (36.6 C)  TempSrc:  Oral  SpO2:  100%  Weight: 250 lb (113.4 kg)      General appearance: alert; appears mildly fatigued, but nontoxic; speaking in full sentences and tolerating own secretions HEENT: NCAT; Ears: EACs clear, TMs pearly gray; Eyes: PERRL.  EOM grossly intact. Sinuses: mildly TTP; Nose: nares patent without rhinorrhea, left turbinates slightly swollen and erythematous, Throat: oropharynx clear, tonsils non erythematous or enlarged, uvula midline  Neck: supple without LAD Lungs: unlabored respirations, symmetrical air entry; cough: mild; no respiratory distress; CTAB Heart: regular rate and rhythm.  Radial pulses 2+ symmetrical bilaterally Skin: warm and dry Psychological: alert and cooperative; normal mood and affect  LABS:  Results for orders placed or performed during the  hospital encounter of 03/08/18 (from the past 24 hour(s))  POCT rapid strep A [facility])     Status: None   Collection Time: 03/08/18  6:20 PM  Result Value Ref Range   Streptococcus, Group A Screen (Direct) NEGATIVE NEGATIVE     ASSESSMENT & PLAN:  1. Acute non-recurrent sinusitis, unspecified location     Meds ordered this encounter  Medications  . amoxicillin-clavulanate (AUGMENTIN) 875-125 MG tablet    Sig: Take 1 tablet by mouth every 12 (twelve) hours for 10 days.    Dispense:  20 tablet    Refill:  0    Order Specific Question:   Supervising Provider    Answer:   Raylene Everts [[id number]]   Strep was negative.  We will send out to culture and call you with abnormal results Get plenty of rest and push fluids You may use OTC flonase or allergy medications daily for symptomatic relief Augmentin prescribed for sinus infection.  Take as directed and to completion Follow up with PCP or with [facility] if symptoms persist Return or go to ER if you have any new or worsening symptoms fever, chills, nausea, vomiting, chest pain, cough, shortness of breath, wheezing, abdominal pain, changes in bowel or bladder habits, etc...  Reviewed expectations re: course of current medical issues. Questions answered. Outlined signs and symptoms indicating need for more acute intervention. Patient verbalized understanding. After Visit Summary given.         Lestine Box, PA-C 03/08/18 1829

## 2018-03-08 NOTE — ED Triage Notes (Signed)
Pt cc coughing , sore throat x 1 week.

## 2018-03-11 LAB — CULTURE, GROUP A STREP (THRC)

## 2018-04-20 ENCOUNTER — Ambulatory Visit: Payer: BLUE CROSS/BLUE SHIELD | Admitting: Obstetrics and Gynecology

## 2018-05-02 NOTE — Progress Notes (Signed)
37 y.o. G1P1001 Single Black or African American Not Hispanic or Latino female here for annual exam.    Period Cycle (Days): 28 Period Duration (Days): 3 days Period Pattern: Regular Menstrual Flow: Light, Moderate, Heavy Menstrual Control: Thin pad Menstrual Control Change Freq (Hours): changes pad every 3 hours Dysmenorrhea: None  Patient's last menstrual period was 04/10/2018 (exact date).          Sexually active: No.  The current method of family planning is abstinence.   Exercising: No.  The patient does not participate in regular exercise at present. Smoker:  no  Health Maintenance: Pap:  04/06/2017 LSIL POS HPV History of abnormal Pap:  Yes, colpo on 05/02/2017 CIN II, LEEP on 06/29/2017 low and high grade dysplasia with negative margins. MMG:  Never Colonoscopy:  Never BMD:   Never TDaP:  11-10-11 Gardasil: No   reports that she has never smoked. She has never used smokeless tobacco. She reports that she does not drink alcohol or use drugs. She works in [place] [employer]. Son is 6, Leory Plowman   Past Medical History:  Diagnosis Date  . Allergy   . Anemia   . Presence of partial dental prosthetic device     Past Surgical History:  Procedure Laterality Date  . right knee surgery  11-12 years ago   arthoscopic surgery for meniscal tear, Dr. Marlou Sa  . TOOTH EXTRACTION      No current outpatient medications on file.   No current facility-administered medications for this visit.     Family History  Problem Relation Age of Onset  . Diabetes Mother   . Other Father        unknown  . Anxiety disorder Sister   . Diabetes Sister   . Hypertension Sister   . Breast cancer Maternal Aunt        breast late 30's  . Cancer Maternal Grandmother        pancreatic  . Heart disease Neg Hx   . Stroke Neg Hx   Mom had 6 sisters, only one with breast cancer.   Review of Systems  Constitutional: Negative.   HENT: Negative.   Eyes: Negative.   Respiratory: Negative.   Cardiovascular:  Negative.   Gastrointestinal: Negative.   Endocrine: Negative.   Genitourinary: Negative.   Musculoskeletal: Negative.   Skin: Negative.   Allergic/Immunologic: Negative.   Neurological: Negative.   Hematological: Negative.   Psychiatric/Behavioral: Negative.     Exam:   BP 118/82 (BP Location: Right Arm, Patient Position: Sitting, Cuff Size: Large)   Pulse 80   Ht 5\' 5"  (1.651 m)   Wt 252 lb (114.3 kg)   LMP 04/10/2018 (Exact Date)   BMI 41.93 kg/m   Weight change: @WEIGHTCHANGE @ Height:   Height: 5\' 5"  (165.1 cm)  Ht Readings from Last 3 Encounters:  05/04/18 5\' 5"  (1.651 m)  12/30/17 5' 4.25" (1.632 m)  08/30/17 5' 4.5" (1.638 m)    General appearance: alert, cooperative and appears stated age Head: Normocephalic, without obvious abnormality, atraumatic Neck: no adenopathy, supple, symmetrical, trachea midline and thyroid normal to inspection and palpation Lungs: clear to auscultation bilaterally Cardiovascular: regular rate and rhythm Breasts: normal appearance, no masses or tenderness Abdomen: soft, non-tender; non distended,  no masses,  no organomegaly Extremities: extremities normal, atraumatic, no cyanosis or edema Skin: Skin color, texture, turgor normal. No rashes or lesions Lymph nodes: Cervical, supraclavicular, and axillary nodes normal. No abnormal inguinal nodes palpated Neurologic: Grossly normal   Pelvic: External genitalia:  no lesions              Urethra:  normal appearing urethra with no masses, tenderness or lesions              Bartholins and Skenes: normal                 Vagina: normal appearing vagina with normal color and discharge, no lesions              Cervix: no lesions and evidence of leep, well healed               Bimanual Exam:  Uterus:  normal size, contour, position, consistency, mobility, non-tender and anteverted              Adnexa: no mass, fullness, tenderness               Rectovaginal: Confirms               Anus:  normal  sphincter tone, no lesions  Chaperone was present for exam.  A:  Well Woman with normal exam  BMI 41.9  P:   Pap with hpv  Return for fasting labs, HgbA1c and TSH  Mammogram after she turns 38  Discussed breast self exam  Discussed calcium and vit D intake

## 2018-05-04 ENCOUNTER — Other Ambulatory Visit: Payer: Self-pay

## 2018-05-04 ENCOUNTER — Ambulatory Visit: Payer: BLUE CROSS/BLUE SHIELD | Admitting: Obstetrics and Gynecology

## 2018-05-04 ENCOUNTER — Encounter: Payer: Self-pay | Admitting: Obstetrics and Gynecology

## 2018-05-04 ENCOUNTER — Other Ambulatory Visit (HOSPITAL_COMMUNITY)
Admission: RE | Admit: 2018-05-04 | Discharge: 2018-05-04 | Disposition: A | Discharge status: Home / Self Care | Payer: BLUE CROSS/BLUE SHIELD | Source: Ambulatory Visit | Attending: Obstetrics and Gynecology | Admitting: Obstetrics and Gynecology

## 2018-05-04 VITALS — BP 118/82 | HR 80 | Ht 65.0 in | Wt 252.0 lb

## 2018-05-04 DIAGNOSIS — Z124 Encounter for screening for malignant neoplasm of cervix: Secondary | ICD-10-CM | POA: Insufficient documentation

## 2018-05-04 DIAGNOSIS — Z01419 Encounter for gynecological examination (general) (routine) without abnormal findings: Secondary | ICD-10-CM

## 2018-05-04 DIAGNOSIS — Z0000 Encounter for general adult medical examination without abnormal findings: Secondary | ICD-10-CM | POA: Diagnosis not present

## 2018-05-04 DIAGNOSIS — Z87410 Personal history of cervical dysplasia: Secondary | ICD-10-CM | POA: Diagnosis not present

## 2018-05-04 DIAGNOSIS — Z833 Family history of diabetes mellitus: Secondary | ICD-10-CM

## 2018-05-04 DIAGNOSIS — Z6841 Body Mass Index (BMI) 40.0 and over, adult: Secondary | ICD-10-CM | POA: Diagnosis not present

## 2018-05-04 NOTE — Patient Instructions (Signed)

## 2018-05-09 LAB — CYTOLOGY - PAP: HPV (WINDOPATH): NOT DETECTED

## 2018-05-10 ENCOUNTER — Telehealth: Payer: Self-pay

## 2018-05-10 NOTE — Telephone Encounter (Signed)
Spoke with patient. Results given. Patient verbalizes understanding. Patient is unable to schedule colposcopy at this time. Reports she will return call to schedule after reviewing her work schedule.

## 2018-05-10 NOTE — Telephone Encounter (Signed)
-----   Message from Salvadore Dom, MD sent at 05/09/2018  1:07 PM EDT ----- Please let the patient know that her pap returned with CIN I, negative for high risk HPV. She had a leep last year with negative margins and negative ECC. She does need another colposcopy, need to look at her cervix and her vagina (could have dysplasia in the vagina)

## 2018-05-18 NOTE — Telephone Encounter (Signed)
Left message to call Kaitlyn at [phone]. 

## 2018-06-02 NOTE — Telephone Encounter (Signed)
Left message to call Kaitlyn at [phone]. 

## 2018-07-14 ENCOUNTER — Telehealth: Payer: Self-pay

## 2018-07-14 NOTE — Telephone Encounter (Signed)
Erroneous encounter

## 2018-07-14 NOTE — Telephone Encounter (Signed)
Left message to call Caira Poche at [phone]. 

## 2018-07-26 ENCOUNTER — Telehealth: Payer: Self-pay

## 2018-07-26 NOTE — Telephone Encounter (Signed)
Dr.Jertson, please advise next steps. Unable to reach patient. Patient is aware of the need to schedule colposcopy. Has not returned calls.

## 2018-07-26 NOTE — Telephone Encounter (Signed)
Please send a letter to the patient.

## 2018-07-26 NOTE — Telephone Encounter (Signed)
Please see telephone note from 05/10/2018.

## 2018-07-27 NOTE — Telephone Encounter (Signed)
Letter pending Dr.Jertson's review.

## 2018-07-28 ENCOUNTER — Telehealth: Payer: Self-pay | Admitting: Obstetrics and Gynecology

## 2018-07-28 NOTE — Telephone Encounter (Signed)
Letter mailed to patient's home address on file. Encounter closed. °

## 2018-07-28 NOTE — Telephone Encounter (Signed)
Patient returning call to office in regards to scheduling colposcopy. Patient requests insurance benefits before scheduling.   Cc: Kaitlyn Sprague and Dr Talbert Nan as Juluis Rainier

## 2018-07-31 NOTE — Telephone Encounter (Signed)
Returned call to patient. Reviewed benefits for colposcopy. Patient understood information presented. Patient had additional benefit questions, advised patient will review with practice administrator and the will return call  Forwarding to Thayer Ohm

## 2018-07-31 NOTE — Telephone Encounter (Signed)
Returned call to patient. Answered additional questions, see account notes. Patient is scheduled for a colposcopy on 08/10/2018 with Dr Talbert Nan. Patient is aware of appointment date, arrival time and cancellation policy. Patient has not additional questions.   Forwarding to Dr Talbert Nan for final review. Patient agreeable to disposition. Will close encounter  cc: Thayer Ohm

## 2018-08-08 ENCOUNTER — Other Ambulatory Visit: Payer: Self-pay

## 2018-08-09 NOTE — Progress Notes (Signed)
GYNECOLOGY  VISIT   HPI: 38 y.o.   Single Black or African American Not Hispanic or Latino  female   [id number] with Patient's last menstrual period was 08/01/2018 (exact date).   here for colposcopy due to CIN I on pap neg HPV. H/O leep in 5/19, HSIL with negative margins and negative ECC. Recent pap with LSIL with negative HPV. She hasn't been sexually active since her LEEP.  GYNECOLOGIC HISTORY: Patient's last menstrual period was 08/01/2018 (exact date). Contraception:Abstinence Menopausal hormone therapy: None        OB History    Gravida  1   Para  1   Term  1   Preterm  0   AB  0   Living  1     SAB  0   TAB  0   Ectopic  0   Multiple  0   Live Births  1              Patient Active Problem List   Diagnosis Date Noted  . Anemia     Past Medical History:  Diagnosis Date  . Allergy   . Anemia   . Presence of partial dental prosthetic device     Past Surgical History:  Procedure Laterality Date  . right knee surgery  11-12 years ago   arthoscopic surgery for meniscal tear, Dr. Marlou Sa  . TOOTH EXTRACTION      No current outpatient medications on file.   No current facility-administered medications for this visit.      ALLERGIES: Patient has no known allergies.  Family History  Problem Relation Age of Onset  . Diabetes Mother   . Other Father        unknown  . Anxiety disorder Sister   . Diabetes Sister   . Hypertension Sister   . Breast cancer Maternal Aunt        breast late 30's  . Cancer Maternal Grandmother        pancreatic  . Heart disease Neg Hx   . Stroke Neg Hx     Social History   Socioeconomic History  . Marital status: Single    Spouse name: Not on file  . Number of children: Not on file  . Years of education: Not on file  . Highest education level: Not on file  Occupational History  . Not on file  Social Needs  . Financial resource strain: Not on file  . Food insecurity    Worry: Not on file    Inability: Not  on file  . Transportation needs    Medical: Not on file    Non-medical: Not on file  Tobacco Use  . Smoking status: Never Smoker  . Smokeless tobacco: Never Used  Substance and Sexual Activity  . Alcohol use: No    Alcohol/week: 0.0 standard drinks  . Drug use: No  . Sexual activity: Not Currently    Partners: Male    Birth control/protection: Abstinence  Lifestyle  . Physical activity    Days per week: Not on file    Minutes per session: Not on file  . Stress: Not on file  Relationships  . Social Herbalist on phone: Not on file    Gets together: Not on file    Attends religious service: Not on file    Active member of club or organization: Not on file    Attends meetings of clubs or organizations: Not on file  Relationship status: Not on file  . Intimate partner violence    Fear of current or ex partner: Not on file    Emotionally abused: Not on file    Physically abused: Not on file    Forced sexual activity: Not on file  Other Topics Concern  . 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    Review of Systems  Constitutional: Negative.   HENT: Negative.   Eyes: Negative.   Respiratory: Negative.   Cardiovascular: Negative.   Gastrointestinal: Negative.   Genitourinary: Negative.   Musculoskeletal: Negative.   Skin: Negative.   Neurological: Negative.   Endo/Heme/Allergies: Negative.   Psychiatric/Behavioral: Negative.     PHYSICAL EXAMINATION:    BP 118/84 (BP Location: Right Arm, Patient Position: Sitting, Cuff Size: Large)   Pulse 84   Temp 97.9 F (36.6 C) (Skin)   Wt 247 lb 3.2 oz (112.1 kg)   LMP 08/01/2018 (Exact Date)   BMI 41.14 kg/m     General appearance: alert, cooperative and appears stated age  Pelvic: External genitalia:  no lesions              Urethra:  normal appearing urethra with no masses, tenderness or  lesions              Bartholins and Skenes: normal                 Vagina: normal appearing vagina with normal color and discharge, no lesions              Cervix: no gross lesions                Colposcopy: Satisfactory, aceto-white changes seen on the posterior cervical lip. Biopsies from 5 and 7 o'clock, ECC done. Biopsy sites treated with silver nitrate. Negative lugols examination of the upper vagina.  Chaperone was present for exam.  ASSESSMENT LSIL pap, negative HPV. Discussed her abnormal pap and her leep with results.  H/O Leep last year, pathology with CIN I-II, negative margins, negative ECC    PLAN Colposcopy with biopsies and ECC Further plans depending on results   An After Visit Summary was printed and given to the patient.

## 2018-08-10 ENCOUNTER — Other Ambulatory Visit: Payer: Self-pay

## 2018-08-10 ENCOUNTER — Encounter: Payer: Self-pay | Admitting: Obstetrics and Gynecology

## 2018-08-10 ENCOUNTER — Ambulatory Visit (INDEPENDENT_AMBULATORY_CARE_PROVIDER_SITE_OTHER): Payer: BC Managed Care – PPO | Admitting: Obstetrics and Gynecology

## 2018-08-10 VITALS — BP 118/84 | HR 84 | Temp 97.9°F | Wt 247.2 lb

## 2018-08-10 DIAGNOSIS — N870 Mild cervical dysplasia: Secondary | ICD-10-CM | POA: Diagnosis not present

## 2018-08-10 DIAGNOSIS — Z9889 Other specified postprocedural states: Secondary | ICD-10-CM

## 2018-08-10 DIAGNOSIS — R87612 Low grade squamous intraepithelial lesion on cytologic smear of cervix (LGSIL): Secondary | ICD-10-CM | POA: Diagnosis not present

## 2018-08-10 NOTE — Patient Instructions (Signed)

## 2019-04-29 ENCOUNTER — Encounter: Payer: Self-pay | Admitting: Emergency Medicine

## 2019-04-29 ENCOUNTER — Other Ambulatory Visit: Payer: Self-pay

## 2019-04-29 ENCOUNTER — Ambulatory Visit
Admission: EM | Admit: 2019-04-29 | Discharge: 2019-04-29 | Disposition: A | Discharge status: Home / Self Care | Payer: 59 | Attending: Emergency Medicine | Admitting: Emergency Medicine

## 2019-04-29 DIAGNOSIS — R0789 Other chest pain: Secondary | ICD-10-CM | POA: Diagnosis not present

## 2019-04-29 MED ORDER — NAPROXEN 500 MG PO TABS: 500.0000 mg | ORAL_TABLET | Freq: Two times a day (BID) | ORAL | 0 refills | Status: DC

## 2019-04-29 NOTE — ED Triage Notes (Signed)
Pt presents to [facility] for assessment of intermittent mild pressure to mid and left chest.  States changing positions and standing up seem to help.  States she had some pressure on the way here, but none at this time.  Patient states she has not been wearing a bra for almost a year and started wearing one again recently and it seems to have made it worse.  States arching her back makes the pain better as well.

## 2019-04-29 NOTE — ED Provider Notes (Signed)
[facility]    CSN: [id number] Arrival date & time: 04/29/19  1403      History   Chief Complaint Chief Complaint  Patient presents with  . Chest Pain    HPI Kelly Cox is a 39 y.o. female with history of allergies, obesity presenting for mid chest pain that has been intermittent for the last 2 days.  States she has recently increased her physical activity level: No walking for 1 hour daily.  States she has been able to do this yesterday and today as well without increased dyspnea, palpitations, chest pain, nausea, vomiting.  Patient denies symptoms during pressure-like episodes.  States is better with certain movements.  Has not tried thing for this.  Patient denies personal or family history of DVT/PE.  No prolonged stasis, travel, fracture, leg swelling, tobacco or OCP use.   Past Medical History:  Diagnosis Date  . Allergy   . Anemia   . Presence of partial dental prosthetic device     Patient Active Problem List   Diagnosis Date Noted  . Anemia     Past Surgical History:  Procedure Laterality Date  . right knee surgery  11-12 years ago   arthoscopic surgery for meniscal tear, Dr. Marlou Sa  . TOOTH EXTRACTION      OB History    Gravida  1   Para  1   Term  1   Preterm  0   AB  0   Living  1     SAB  0   TAB  0   Ectopic  0   Multiple  0   Live Births  1            Home Medications    Prior to Admission medications   Medication Sig Start Date End Date Taking? Authorizing Provider  naproxen (NAPROSYN) 500 MG tablet Take 1 tablet (500 mg total) by mouth 2 (two) times daily. 04/29/19   Hall-Potvin, [place], PA-C    Family History Family History  Problem Relation Age of Onset  . Diabetes Mother   . Other Father        unknown  . Anxiety disorder Sister   . Diabetes Sister   . Hypertension Sister   . Breast cancer Maternal Aunt        breast late 30's  . Cancer Maternal Grandmother        pancreatic  . Heart  disease Neg Hx   . Stroke Neg Hx     Social History Social History   Tobacco Use  . Smoking status: Never Smoker  . Smokeless tobacco: Never Used  Substance Use Topics  . Alcohol use: No    Alcohol/week: 0.0 standard drinks  . Drug use: No     Allergies   Patient has no known allergies.   Review of Systems As per HPI   Physical Exam Triage Vital Signs ED Triage Vitals  Enc Vitals Group     BP      Pulse      Resp      Temp      Temp src      SpO2      Weight      Height      Head Circumference      Peak Flow      Pain Score      Pain Loc      Pain Edu?      Excl. in [place]?  No data found.  Updated Vital Signs BP 122/77 (BP Location: Left Arm)   Pulse 83   Temp 97.8 F (36.6 C) (Temporal)   Resp 16   LMP 04/21/2019   SpO2 95%   Visual Acuity Right Eye Distance:   Left Eye Distance:   Bilateral Distance:    Right Eye Near:   Left Eye Near:    Bilateral Near:     Physical Exam Vitals reviewed.  Constitutional:      General: She is not in acute distress.    Appearance: She is obese. She is not ill-appearing or diaphoretic.  HENT:     Head: Normocephalic and atraumatic.  Eyes:     General: No scleral icterus.       Right eye: No discharge.        Left eye: No discharge.     Extraocular Movements: Extraocular movements intact.     Pupils: Pupils are equal, round, and reactive to light.  Neck:     Vascular: No JVD.     Trachea: No tracheal deviation.  Cardiovascular:     Rate and Rhythm: Normal rate and regular rhythm.     Pulses: Normal pulses.     Heart sounds: Heart sounds not distant. No murmur. No systolic murmur. No diastolic murmur. No S3 or S4 sounds.   Pulmonary:     Effort: Pulmonary effort is normal. No tachypnea, accessory muscle usage or respiratory distress.     Breath sounds: No stridor. No wheezing.  Chest:     Chest wall: Tenderness present. No deformity, swelling or crepitus.     Comments: Midsternal chest TTP without  radiation Abdominal:     General: Abdomen is flat.     Palpations: Abdomen is soft.     Tenderness: There is no abdominal tenderness. There is no guarding.  Musculoskeletal:     Cervical back: Normal range of motion and neck supple. No muscular tenderness.  Lymphadenopathy:     Cervical: No cervical adenopathy.  Skin:    General: Skin is warm.     Capillary Refill: Capillary refill takes less than 2 seconds.     Coloration: Skin is not jaundiced or pale.     Findings: No rash.  Neurological:     General: No focal deficit present.     Mental Status: She is alert and oriented to person, place, and time.  Psychiatric:        Mood and Affect: Mood normal.        Thought Content: Thought content normal.      UC Treatments / Results  Labs (all labs ordered are listed, but only abnormal results are displayed) Labs Reviewed - No data to display  EKG   Radiology No results found.  Procedures Procedures (including critical care time)  Medications Ordered in UC Medications - No data to display  Initial Impression / Assessment and Plan / UC Course  I have reviewed the triage vital signs and the nursing notes.  Pertinent labs & imaging results that were available during my care of the patient were reviewed by me and considered in my medical decision making (see chart for details).     Patient afebrile, nontoxic, hemodynamically stable in office.  EKG done in office, reviewed by me and compared to previous from symptom of 2016: Normal sinus rhythm with slight right axis deviation and QRS complex abnormalities in leads I, 3, aVF without reciprocal changes.  No abnormal waveforms in leads V1 through V6, ST  elevation or depression.  No acute EKG from baseline.  Given recent activity, reproducibility on exam, and ability to continue hour-long walks without dyspnea or chest pain, I feel this is musculoskeletal this time.  Low concern for ACS at this time.  Will treat supportively as  outlined below.  Return precautions discussed, patient verbalized understanding and is agreeable to plan. Final Clinical Impressions(s) / UC Diagnoses   Final diagnoses:  Musculoskeletal chest pain     Discharge Instructions     Keep log of symptoms. Take naproxen as directed: may add tylenol if needed. May try heat/cold therapy. Follow up with PCP for routine physical. Go to ER for worsening pain, difficulty breathing, lightheadedness, nausea, vomiting.    ED Prescriptions    Medication Sig Dispense Auth. Provider   naproxen (NAPROSYN) 500 MG tablet Take 1 tablet (500 mg total) by mouth 2 (two) times daily. 30 tablet Hall-Potvin, [place], PA-C     PDMP not reviewed this encounter.   Hall-Potvin, [place], [place] 04/29/19 1643

## 2019-04-29 NOTE — Discharge Instructions (Addendum)
Keep log of symptoms. Take naproxen as directed: may add tylenol if needed. May try heat/cold therapy. Follow up with PCP for routine physical. Go to ER for worsening pain, difficulty breathing, lightheadedness, nausea, vomiting.

## 2019-05-07 NOTE — Progress Notes (Signed)
39 y.o. G1P1001 Single Black or African American Not Hispanic or Latino female here for annual exam.   H/O LEEP in 5/19, HSIL with negative margins and negative HPV. Pap from 3/20 with LSIL, negative HPV. Colposcopic biopsies with CIN I, ECC was negative.  Not sexually active in the last year.   Period Cycle (Days): 28 Period Duration (Days): 3-4 Period Pattern: Regular Menstrual Flow: Heavy, Moderate, Light Menstrual Control: Maxi pad Menstrual Control Change Freq (Hours): 2-2.5 Dysmenorrhea: None  Patient's last menstrual period was 04/21/2019.          Sexually active: No.  The current method of family planning is abstinence.    Exercising: Yes.    walking  Smoker:  no  Health Maintenance: Pap:05/04/2018 LSIL Neg HPV,  colpo biopsies with CIN I, negative ECC 04/06/2017 LSIL POS HPV, colpo with CIN II, LEEP on 06/29/2017 low and high grade dysplasia with negative margins History of abnormal Pap:  yes MMG:  Never  BMD:   Never  Colonoscopy: never  TDaP:  11/10/11  Gardasil: Completed    reports that she has never smoked. She has never used smokeless tobacco. She reports that she does not drink alcohol or use drugs. 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 Pandora Leiter, Leory Plowman is 5   Past Medical History:  Diagnosis Date  . Allergy   . Anemia   . Presence of partial dental prosthetic device     Past Surgical History:  Procedure Laterality Date  . right knee surgery  11-12 years ago   arthoscopic surgery for meniscal tear, Dr. Marlou Sa  . TOOTH EXTRACTION      No current outpatient medications on file.   No current facility-administered medications for this visit.    Family History  Problem Relation Age of Onset  . Diabetes Mother   . Other Father        unknown  . Anxiety disorder Sister   . Diabetes Sister   . Hypertension Sister   . Breast cancer Maternal Aunt        breast late 30's  . Cancer Maternal Grandmother        pancreatic  . Heart disease Neg Hx    . Stroke Neg Hx     Review of Systems  All other systems reviewed and are negative.   Exam:   BP 122/64   Pulse 92   Temp 97.7 F (36.5 C)   Ht 5' 4.5" (1.638 m)   Wt 245 lb (111.1 kg)   LMP 04/21/2019   SpO2 96%   BMI 41.40 kg/m   Weight change: @WEIGHTCHANGE @ Height:   Height: 5' 4.5" (163.8 cm)  Ht Readings from Last 3 Encounters:  05/09/19 5' 4.5" (1.638 m)  05/04/18 5\' 5"  (1.651 m)  12/30/17 5' 4.25" (1.632 m)    General appearance: alert, cooperative and appears stated age Head: Normocephalic, without obvious abnormality, atraumatic Neck: no adenopathy, supple, symmetrical, trachea midline and thyroid lump in the lateral left thyroid lobe Lungs: clear to auscultation bilaterally Cardiovascular: regular rate and rhythm Breasts: normal appearance, no masses or tenderness Abdomen: soft, non-tender; non distended,  no masses,  no organomegaly Extremities: extremities normal, atraumatic, no cyanosis or edema Skin: Skin color, texture, turgor normal. No rashes or lesions Lymph nodes: Cervical, supraclavicular, and axillary nodes normal. No abnormal inguinal nodes palpated Neurologic: Grossly normal   Pelvic: External genitalia:  no lesions  Urethra:  normal appearing urethra with no masses, tenderness or lesions              Bartholins and Skenes: normal                 Vagina: normal appearing vagina with normal color and discharge, no lesions              Cervix: no lesions               Bimanual Exam:  Uterus:  normal size, contour, position, consistency, mobility, non-tender and anteverted              Adnexa: no mass, fullness, tenderness               Rectovaginal: Confirms               Anus:  normal sphincter tone, no lesions  Gae Dry chaperoned for the exam.  A:  Well Woman with normal exam  H/O LEEP, then CIN I last year  BMI 41  Lump left lobe of thyroid  P:   Pap with hpv  Screening labs (not fasting), HgbA1C, TSH  Mammogram due,  # given  Discussed breast self exam  Discussed calcium and vit D intake  Thyroid ultrasound ordered.

## 2019-05-09 ENCOUNTER — Encounter: Payer: Self-pay | Admitting: Obstetrics and Gynecology

## 2019-05-09 ENCOUNTER — Other Ambulatory Visit: Payer: Self-pay | Admitting: Obstetrics and Gynecology

## 2019-05-09 ENCOUNTER — Other Ambulatory Visit: Payer: Self-pay

## 2019-05-09 ENCOUNTER — Ambulatory Visit: Payer: 59 | Admitting: Obstetrics and Gynecology

## 2019-05-09 VITALS — BP 122/64 | HR 92 | Temp 97.7°F | Ht 64.5 in | Wt 245.0 lb

## 2019-05-09 DIAGNOSIS — Z833 Family history of diabetes mellitus: Secondary | ICD-10-CM

## 2019-05-09 DIAGNOSIS — Z6841 Body Mass Index (BMI) 40.0 and over, adult: Secondary | ICD-10-CM

## 2019-05-09 DIAGNOSIS — Z0000 Encounter for general adult medical examination without abnormal findings: Secondary | ICD-10-CM

## 2019-05-09 DIAGNOSIS — Z124 Encounter for screening for malignant neoplasm of cervix: Secondary | ICD-10-CM

## 2019-05-09 DIAGNOSIS — R87612 Low grade squamous intraepithelial lesion on cytologic smear of cervix (LGSIL): Secondary | ICD-10-CM

## 2019-05-09 DIAGNOSIS — Z01419 Encounter for gynecological examination (general) (routine) without abnormal findings: Secondary | ICD-10-CM | POA: Diagnosis not present

## 2019-05-09 DIAGNOSIS — Z1231 Encounter for screening mammogram for malignant neoplasm of breast: Secondary | ICD-10-CM

## 2019-05-09 DIAGNOSIS — Z9889 Other specified postprocedural states: Secondary | ICD-10-CM | POA: Diagnosis not present

## 2019-05-09 DIAGNOSIS — E079 Disorder of thyroid, unspecified: Secondary | ICD-10-CM

## 2019-05-09 NOTE — Patient Instructions (Signed)

## 2019-05-10 ENCOUNTER — Other Ambulatory Visit: Payer: Self-pay | Admitting: *Deleted

## 2019-05-10 ENCOUNTER — Other Ambulatory Visit (HOSPITAL_COMMUNITY)
Admission: RE | Admit: 2019-05-10 | Discharge: 2019-05-10 | Disposition: A | Discharge status: Home / Self Care | Payer: 59 | Source: Ambulatory Visit | Attending: Obstetrics and Gynecology | Admitting: Obstetrics and Gynecology

## 2019-05-10 DIAGNOSIS — E079 Disorder of thyroid, unspecified: Secondary | ICD-10-CM

## 2019-05-10 DIAGNOSIS — Z124 Encounter for screening for malignant neoplasm of cervix: Secondary | ICD-10-CM | POA: Diagnosis present

## 2019-05-10 DIAGNOSIS — R87612 Low grade squamous intraepithelial lesion on cytologic smear of cervix (LGSIL): Secondary | ICD-10-CM | POA: Diagnosis not present

## 2019-05-10 LAB — CBC
Hematocrit: 37 % (ref 34.0–46.6)
Hemoglobin: 11.1 g/dL (ref 11.1–15.9)
MCH: 22.9 pg — ABNORMAL LOW (ref 26.6–33.0)
MCHC: 30 g/dL — ABNORMAL LOW (ref 31.5–35.7)
MCV: 76 fL — ABNORMAL LOW (ref 79–97)
Platelets: 323 10*3/uL (ref 150–450)
RBC: 4.84 x10E6/uL (ref 3.77–5.28)
RDW: 16.3 % — ABNORMAL HIGH (ref 11.7–15.4)
WBC: 9.5 10*3/uL (ref 3.4–10.8)

## 2019-05-10 LAB — COMPREHENSIVE METABOLIC PANEL
ALT: 13 IU/L (ref 0–32)
AST: 11 IU/L (ref 0–40)
Albumin/Globulin Ratio: 1.3 (ref 1.2–2.2)
Albumin: 4 g/dL (ref 3.8–4.8)
Alkaline Phosphatase: 87 IU/L (ref 39–117)
BUN/Creatinine Ratio: 27 — ABNORMAL HIGH (ref 9–23)
BUN: 20 mg/dL (ref 6–24)
Bilirubin Total: <0.2 mg/dL (ref 0.0–1.2)
CO2: 20 mmol/L (ref 20–29)
Calcium: 9.6 mg/dL (ref 8.7–10.2)
Chloride: 103 mmol/L (ref 96–106)
Creatinine, Ser: 0.73 mg/dL (ref 0.57–1.00)
GFR calc Af Amer: 119 mL/min/{1.73_m2} (ref >59)
GFR calc non Af Amer: 103 mL/min/{1.73_m2} (ref >59)
Globulin, Total: 3 g/dL (ref 1.5–4.5)
Glucose: 97 mg/dL (ref 65–99)
Potassium: 4.3 mmol/L (ref 3.5–5.2)
Sodium: 138 mmol/L (ref 134–144)
Total Protein: 7 g/dL (ref 6.0–8.5)

## 2019-05-10 LAB — LIPID PANEL
Chol/HDL Ratio: 5.2 ratio — ABNORMAL HIGH (ref 0.0–4.4)
Cholesterol, Total: 202 mg/dL — ABNORMAL HIGH (ref 100–199)
HDL: 39 mg/dL — ABNORMAL LOW (ref >39)
LDL Chol Calc (NIH): 149 mg/dL — ABNORMAL HIGH (ref 0–99)
Triglycerides: 78 mg/dL (ref 0–149)
VLDL Cholesterol Cal: 14 mg/dL (ref 5–40)

## 2019-05-10 LAB — HEMOGLOBIN A1C
Est. average glucose Bld gHb Est-mCnc: 117 mg/dL
Hgb A1c MFr Bld: 5.7 % — ABNORMAL HIGH (ref 4.8–5.6)

## 2019-05-10 LAB — TSH: TSH: 0.609 u[IU]/mL (ref 0.450–4.500)

## 2019-05-10 NOTE — Addendum Note (Signed)
Addended by: Dorothy Spark on: 05/10/2019 12:31 PM   Modules accepted: Orders

## 2019-05-14 LAB — CYTOLOGY - PAP
Comment: NEGATIVE
Diagnosis: NEGATIVE
Diagnosis: REACTIVE
High risk HPV: NEGATIVE

## 2019-05-21 ENCOUNTER — Ambulatory Visit
Admission: RE | Admit: 2019-05-21 | Discharge: 2019-05-21 | Disposition: A | Discharge status: Home / Self Care | Payer: 59 | Source: Ambulatory Visit | Attending: Obstetrics and Gynecology | Admitting: Obstetrics and Gynecology

## 2019-05-21 DIAGNOSIS — E079 Disorder of thyroid, unspecified: Secondary | ICD-10-CM

## 2019-05-23 ENCOUNTER — Other Ambulatory Visit: Payer: Self-pay | Admitting: *Deleted

## 2019-05-23 DIAGNOSIS — E079 Disorder of thyroid, unspecified: Secondary | ICD-10-CM

## 2019-05-23 DIAGNOSIS — E041 Nontoxic single thyroid nodule: Secondary | ICD-10-CM

## 2019-06-01 ENCOUNTER — Ambulatory Visit
Admission: RE | Admit: 2019-06-01 | Discharge: 2019-06-01 | Disposition: A | Discharge status: Home / Self Care | Payer: 59 | Source: Ambulatory Visit | Attending: Obstetrics and Gynecology | Admitting: Obstetrics and Gynecology

## 2019-06-01 ENCOUNTER — Other Ambulatory Visit: Payer: Self-pay

## 2019-06-01 DIAGNOSIS — Z1231 Encounter for screening mammogram for malignant neoplasm of breast: Secondary | ICD-10-CM

## 2019-06-04 ENCOUNTER — Other Ambulatory Visit: Payer: Self-pay | Admitting: Obstetrics and Gynecology

## 2019-06-04 DIAGNOSIS — R928 Other abnormal and inconclusive findings on diagnostic imaging of breast: Secondary | ICD-10-CM

## 2019-06-18 ENCOUNTER — Other Ambulatory Visit: Payer: 59

## 2019-06-25 ENCOUNTER — Ambulatory Visit
Admission: RE | Admit: 2019-06-25 | Discharge: 2019-06-25 | Disposition: A | Discharge status: Home / Self Care | Payer: 59 | Source: Ambulatory Visit | Attending: Obstetrics and Gynecology | Admitting: Obstetrics and Gynecology

## 2019-06-25 ENCOUNTER — Other Ambulatory Visit: Payer: Self-pay

## 2019-06-25 DIAGNOSIS — R928 Other abnormal and inconclusive findings on diagnostic imaging of breast: Secondary | ICD-10-CM

## 2019-08-02 ENCOUNTER — Ambulatory Visit: Payer: 59 | Admitting: Orthopedic Surgery

## 2019-08-02 ENCOUNTER — Ambulatory Visit: Payer: Self-pay

## 2019-08-02 ENCOUNTER — Encounter: Payer: Self-pay | Admitting: Orthopedic Surgery

## 2019-08-02 DIAGNOSIS — M79601 Pain in right arm: Secondary | ICD-10-CM | POA: Diagnosis not present

## 2019-08-02 DIAGNOSIS — G8929 Other chronic pain: Secondary | ICD-10-CM | POA: Diagnosis not present

## 2019-08-02 DIAGNOSIS — M25511 Pain in right shoulder: Secondary | ICD-10-CM

## 2019-08-02 DIAGNOSIS — M61411 Other calcification of muscle, right shoulder: Secondary | ICD-10-CM

## 2019-08-04 ENCOUNTER — Encounter: Payer: Self-pay | Admitting: Orthopedic Surgery

## 2019-08-04 NOTE — Progress Notes (Signed)
Office Visit Note   Patient: Kelly Cox           Date of Birth: Jan 06, 1981           MRN: [id number] Visit Date: 08/02/2019 Requested by: No referring provider defined for this encounter. PCP: Patient, No Pcp Per  Subjective: Chief Complaint  Patient presents with  . Arm Pain    HPI: Kelly Cox is a 39 y.o. female who presents to the office complaining of right shoulder pain.  Patient notes that she injured her right shoulder on 05/02/2019 when she was running with her son and her son cut in front of her causing her to fall down.  This was while running downhill.  She noticed pain several days after the fall in her right shoulder.  She localizes pain to the anterior and posterior aspects of the right shoulder with radiation to the elbow.  Pain is worse with lifting as well as bringing her arm behind her back but she has no functional limitation.  Pain does wake her up at night on occasion.  She is not taking any medications for pain.  Denies any significant stiffness or mechanical symptoms.  She has been taking some Advil with some relief.  She also notes neck pain but states that this is only been going on about a week.  Denies any numbness or tingling.  She has no history of shoulder surgery, injection, MRI.  Denies any history of diabetes.  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                ROS:  All systems reviewed are negative as they relate to the chief complaint within the history of present illness.  Patient denies fevers or chills.  Assessment & Plan: Visit Diagnoses:  1. Right arm pain   2. Chronic right shoulder pain   3. Other calcification of muscle, right shoulder     Plan: Patient is a 39 year old female who presents complaining of right shoulder pain.  This is following an injury on 05/02/2019 where she was running downhill and tripped over her son causing her to fall forward.  Radiographs of the right shoulder reveal no significant findings except for a  calcification in the superior/anterior aspect of her right shoulder.  This calcification is palpable on exam as well as tender.  She has good strength of the rotator cuff as well as equivalent range of motion to the contralateral shoulder.  With the long history of pain along with waking with pain, ordered MRI arthrogram of the right shoulder for further evaluation of the shoulder and the calcification.  Hold off on any injection until MRI results.  Patient agreed with this plan.  Follow-Up Instructions: No follow-ups on file.   Orders:  Orders Placed This Encounter  Procedures  . XR Shoulder Right  . XR Cervical Spine 2 or 3 views  . MR SHOULDER RIGHT W CONTRAST  . Arthrogram   No orders of the defined types were placed in this encounter.     Procedures: No procedures performed   Clinical Data: No additional findings.  Objective: Vital Signs: There were no vitals taken for this visit.  Physical Exam:  Constitutional: Patient appears well-developed HEENT:  Head: Normocephalic Eyes:EOM are normal Neck: Normal range of motion Cardiovascular: Normal rate Pulmonary/chest: Effort normal Neurologic: Patient is alert Skin: Skin is warm Psychiatric: Patient has normal mood and affect  Ortho Exam:  Right shoulder Exam Able to fully forward flex  and abduct shoulder overhead No loss of ER relative to the other shoulder.  Good endpoint with ER No TTP over the [facility] joint.  Mild to moderate tenderness to palpation over the bicipital groove as well as the area of calcification.  Calcifications palpable on exam which correlates with the right shoulder radiographs. Good subscapularis, supraspinatus, and infraspinatus strength Negative Hawkins impingement 5/5 grip strength, forearm pronation/supination, and bicep strength  Specialty Comments:  No specialty comments available.  Imaging: No results found.   PMFS History: Patient Active Problem List   Diagnosis Date Noted  . Anemia     Past Medical History:  Diagnosis Date  . Allergy   . Anemia   . Presence of partial dental prosthetic device     Family History  Problem Relation Age of Onset  . Diabetes Mother   . Other Father        unknown  . Anxiety disorder Sister   . Diabetes Sister   . Hypertension Sister   . Breast cancer Maternal Aunt        breast late 30's  . Cancer Maternal Grandmother        pancreatic  . Heart disease Neg Hx   . Stroke Neg Hx     Past Surgical History:  Procedure Laterality Date  . right knee surgery  11-12 years ago   arthoscopic surgery for meniscal tear, Dr. Marlou Sa  . TOOTH EXTRACTION     Social History   Occupational History  . Not on file  Tobacco Use  . Smoking status: Never Smoker  . Smokeless tobacco: Never Used  Substance and Sexual Activity  . Alcohol use: No    Alcohol/week: 0.0 standard drinks  . Drug use: No  . Sexual activity: Not Currently    Partners: Male    Birth control/protection: Abstinence

## 2019-08-21 ENCOUNTER — Ambulatory Visit
Admission: RE | Admit: 2019-08-21 | Discharge: 2019-08-21 | Disposition: A | Discharge status: Home / Self Care | Payer: No Typology Code available for payment source | Source: Ambulatory Visit | Attending: Orthopedic Surgery | Admitting: Orthopedic Surgery

## 2019-08-21 ENCOUNTER — Other Ambulatory Visit: Payer: Self-pay

## 2019-08-21 DIAGNOSIS — M79601 Pain in right arm: Secondary | ICD-10-CM

## 2019-08-21 MED ORDER — IOPAMIDOL (ISOVUE-M 200) INJECTION 41%
12.0000 mL | Freq: Once | INTRAMUSCULAR | Status: AC
  Administered 2019-08-21: 12 mL via INTRA_ARTICULAR

## 2019-08-29 ENCOUNTER — Ambulatory Visit: Payer: No Typology Code available for payment source | Admitting: Orthopedic Surgery

## 2019-08-29 DIAGNOSIS — M25511 Pain in right shoulder: Secondary | ICD-10-CM | POA: Diagnosis not present

## 2019-08-29 DIAGNOSIS — G8929 Other chronic pain: Secondary | ICD-10-CM | POA: Diagnosis not present

## 2019-08-31 ENCOUNTER — Encounter: Payer: Self-pay | Admitting: Orthopedic Surgery

## 2019-08-31 NOTE — Progress Notes (Signed)
Office Visit Note   Patient: Kelly Cox           Date of Birth: 1980/05/26           MRN: [id number] Visit Date: 08/29/2019 Requested by: No referring provider defined for this encounter. PCP: Patient, No Pcp Per  Subjective: Chief Complaint  Patient presents with  . Follow-up    HPI: [name] is a 39 year old patient here to review MRI scan of her right shoulder.  She had unusual calcification seen on the axillary radiographic view on her right shoulder before.  Interestingly there is no comment by the radiologist on this calcification; however, on the axial views it is apparent that there appears to be well-circumscribed calcification within the subdermal tissue but not within the muscle itself.  This is palpable on exam and not particularly tender.  MRI scan otherwise show intrasubstance tear of the supraspinatus which is not an operative problem.  Her symptoms are currently manageable.             ROS: All systems reviewed are negative as they relate to the chief complaint within the history of present illness.  Patient denies  fevers or chills.   Assessment & Plan: Visit Diagnoses:  1. Chronic right shoulder pain     Plan: Impression is likely calcification traumatic of anterior subcutaneous tissue to the shoulder.  Plan is repeat radiographs in 6 months and repeat clinical examination.  No intervention required.  Calcification is well-circumscribed on the axillary radiographs and the MRI scan.  No worrisome features to this region at this time.  Follow-Up Instructions: Return in about 6 months (around 02/28/2020).   Orders:  No orders of the defined types were placed in this encounter.  No orders of the defined types were placed in this encounter.     Procedures: No procedures performed   Clinical Data: No additional findings.  Objective: Vital Signs: LMP 08/04/2019 (Exact Date)   Physical Exam:   Constitutional: Patient appears well-developed HEENT:    Head: Normocephalic Eyes:EOM are normal Neck: Normal range of motion Cardiovascular: Normal rate Pulmonary/chest: Effort normal Neurologic: Patient is alert Skin: Skin is warm Psychiatric: Patient has normal mood and affect    Ortho Exam: Ortho exam demonstrates full active and passive range of motion of the right shoulder with pretty reasonable supraspinatus infraspinatus subscap strength.  In the area in question there is a small region which is palpable within her subcutaneous tissue just below the anterior portion of the acromion.  Measures about 2-1/2 x 2 cm.  Not particularly tender to palpation.  No other surrounding lymphadenopathy.  No AC joint tenderness and negative apprehension relocation testing in the shoulder.  Specialty Comments:  No specialty comments available.  Imaging: No results found.   PMFS History: Patient Active Problem List   Diagnosis Date Noted  . Anemia    Past Medical History:  Diagnosis Date  . Allergy   . Anemia   . Presence of partial dental prosthetic device     Family History  Problem Relation Age of Onset  . Diabetes Mother   . Other Father        unknown  . Anxiety disorder Sister   . Diabetes Sister   . Hypertension Sister   . Breast cancer Maternal Aunt        breast late 30's  . Cancer Maternal Grandmother        pancreatic  . Heart disease Neg Hx   . Stroke Neg  Hx     Past Surgical History:  Procedure Laterality Date  . right knee surgery  11-12 years ago   arthoscopic surgery for meniscal tear, Dr. Marlou Sa  . TOOTH EXTRACTION     Social History   Occupational History  . Not on file  Tobacco Use  . Smoking status: Never Smoker  . Smokeless tobacco: Never Used  Vaping Use  . Vaping Use: Never used  Substance and Sexual Activity  . Alcohol use: No    Alcohol/week: 0.0 standard drinks  . Drug use: No  . Sexual activity: Not Currently    Partners: Male    Birth control/protection: Abstinence

## 2019-11-01 ENCOUNTER — Telehealth: Payer: Self-pay

## 2019-11-01 NOTE — Telephone Encounter (Signed)
Kelly Dom, MD  Kelly Cox, Kelly Hallmark, RN I'll ask Verline Lema to call her. She has a thyroid nodule that should be biopsied.  Thanks,  Sharee Pimple       Previous Messages   ----- Message -----  From: Lucienne Minks  Sent: 09/05/2019  9:42 AM EDT  To: Kelly Dom, MD  Subject: General Surgery-Referral             Dr. Talbert Nan,   I have spoken with the patient she does not want to proceed with the referral. Ok to cancel the referral in Epic?   [facility] with patient. Advised patient of strong recommendation from Dr.Jertson to proceed with thyroid biopsy. Advised we do not want her to take any risk in missing or delaying possible thyroid cancer or abnormality. Offered to assist patient with scheduling biopsy or second opinion. Patient declines assistance. Advised to return call if she needs assistance scheduling.

## 2020-03-05 ENCOUNTER — Ambulatory Visit: Payer: No Typology Code available for payment source | Admitting: Orthopedic Surgery

## 2020-05-14 ENCOUNTER — Ambulatory Visit: Payer: 59 | Admitting: Obstetrics and Gynecology

## 2020-08-06 ENCOUNTER — Other Ambulatory Visit: Payer: Self-pay | Admitting: Obstetrics and Gynecology

## 2020-08-06 DIAGNOSIS — Z1231 Encounter for screening mammogram for malignant neoplasm of breast: Secondary | ICD-10-CM

## 2020-08-09 ENCOUNTER — Other Ambulatory Visit: Payer: Self-pay

## 2020-08-09 ENCOUNTER — Ambulatory Visit
Admission: RE | Admit: 2020-08-09 | Discharge: 2020-08-09 | Disposition: A | Discharge status: Home / Self Care | Payer: No Typology Code available for payment source | Source: Ambulatory Visit | Attending: Obstetrics and Gynecology | Admitting: Obstetrics and Gynecology

## 2020-08-09 DIAGNOSIS — Z1231 Encounter for screening mammogram for malignant neoplasm of breast: Secondary | ICD-10-CM

## 2020-08-15 ENCOUNTER — Other Ambulatory Visit: Payer: Self-pay | Admitting: Obstetrics and Gynecology

## 2020-08-15 DIAGNOSIS — R928 Other abnormal and inconclusive findings on diagnostic imaging of breast: Secondary | ICD-10-CM

## 2020-08-25 ENCOUNTER — Ambulatory Visit: Payer: No Typology Code available for payment source

## 2020-08-25 ENCOUNTER — Ambulatory Visit
Admission: RE | Admit: 2020-08-25 | Discharge: 2020-08-25 | Disposition: A | Discharge status: Home / Self Care | Payer: No Typology Code available for payment source | Source: Ambulatory Visit | Attending: Obstetrics and Gynecology | Admitting: Obstetrics and Gynecology

## 2020-08-25 ENCOUNTER — Other Ambulatory Visit: Payer: Self-pay

## 2020-08-25 DIAGNOSIS — R928 Other abnormal and inconclusive findings on diagnostic imaging of breast: Secondary | ICD-10-CM

## 2021-07-09 ENCOUNTER — Other Ambulatory Visit: Payer: Self-pay | Admitting: Obstetrics and Gynecology

## 2021-07-09 DIAGNOSIS — Z1231 Encounter for screening mammogram for malignant neoplasm of breast: Secondary | ICD-10-CM

## 2021-08-10 ENCOUNTER — Ambulatory Visit
Admission: RE | Admit: 2021-08-10 | Discharge: 2021-08-10 | Disposition: A | Discharge status: Home / Self Care | Payer: No Typology Code available for payment source | Source: Ambulatory Visit | Attending: Obstetrics and Gynecology | Admitting: Obstetrics and Gynecology

## 2021-08-10 DIAGNOSIS — Z1231 Encounter for screening mammogram for malignant neoplasm of breast: Secondary | ICD-10-CM

## 2022-03-03 ENCOUNTER — Ambulatory Visit
Admission: EM | Admit: 2022-03-03 | Discharge: 2022-03-03 | Disposition: A | Discharge status: Home / Self Care | Payer: No Typology Code available for payment source | Attending: Physician Assistant | Admitting: Physician Assistant

## 2022-03-03 DIAGNOSIS — J029 Acute pharyngitis, unspecified: Secondary | ICD-10-CM

## 2022-03-03 DIAGNOSIS — R509 Fever, unspecified: Secondary | ICD-10-CM | POA: Diagnosis not present

## 2022-03-03 DIAGNOSIS — J069 Acute upper respiratory infection, unspecified: Secondary | ICD-10-CM | POA: Diagnosis not present

## 2022-03-03 LAB — POCT RAPID STREP A (OFFICE): Rapid Strep A Screen: NEGATIVE

## 2022-03-03 NOTE — Discharge Instructions (Addendum)
Advised to continue taking Mucinex sinus max to control the cough and congestion. Advised take Tylenol or ibuprofen for fever or body discomfort. Since his symptoms have improved over the past 24 hours and the fever has resolved advised to continue to treat the symptoms as they emerge as this is more of a viral type infection. Has follow-up PCP return to urgent care as needed.

## 2022-03-03 NOTE — ED Triage Notes (Signed)
Pt c/o cough, body aches, feels like head is a balloon   Onset ~ 2 days ago   Denies pain in triage

## 2022-03-03 NOTE — ED Provider Notes (Signed)
[facility]    CSN: [id number] Arrival date & time: 03/03/22  0820      History   Chief Complaint Chief Complaint  Patient presents with   Cough    HPI Kelly Cox is a 42 y.o. female.   42 year old female presents with fever, sore throat, cough.  Patient indicates for the past 3 days she has been having persistent upper respiratory symptoms of sinus congestion mainly frontal and maxillary, postnasal drip, rhinitis which has been clear to yellow.  Patient indicates that she also had associated sore throat, progressive painful swallowing which is improved over the past 24 hours.  Patient also indicates she has been having chest congestion with intermittent cough.  The production has been clear to light green.  Patient indicates that she did have fever for the first couple days that was 101 and associated with body aches, muscle soreness, and fatigue.  Patient indicates this morning the fever has resolved, body aches and discomfort has improved.  Patient indicates she has been taking Mucinex sinus max which has helped to improve her symptoms.  Patient denies any nausea, vomiting, wheezing or shortness of breath.  Being around any family or friends that have been sick.   Cough Associated symptoms: rhinorrhea and sore throat     Past Medical History:  Diagnosis Date   Allergy    Anemia    Presence of partial dental prosthetic device     Patient Active Problem List   Diagnosis Date Noted   Anemia     Past Surgical History:  Procedure Laterality Date   right knee surgery  11-12 years ago   arthoscopic surgery for meniscal tear, Dr. Marlou Sa   TOOTH EXTRACTION      OB History     Gravida  1   Para  1   Term  1   Preterm  0   AB  0   Living  1      SAB  0   IAB  0   Ectopic  0   Multiple  0   Live Births  1            Home Medications    Prior to Admission medications   Not on File    Family History Family History  Problem  Relation Age of Onset   Diabetes Mother    Other Father        unknown   Anxiety disorder Sister    Diabetes Sister    Hypertension Sister    Breast cancer Maternal Aunt        breast late 30's   Cancer Maternal Grandmother        pancreatic   Heart disease Neg Hx    Stroke Neg Hx     Social History Social History   Tobacco Use   Smoking status: Never   Smokeless tobacco: Never  Vaping Use   Vaping Use: Never used  Substance Use Topics   Alcohol use: No    Alcohol/week: 0.0 standard drinks of alcohol   Drug use: No     Allergies   Patient has no known allergies.   Review of Systems Review of Systems  HENT:  Positive for postnasal drip, rhinorrhea, sinus pressure and sore throat.   Respiratory:  Positive for cough.      Physical Exam Triage Vital Signs ED Triage Vitals [03/03/22 0832]  Enc Vitals Group     BP 126/75     Pulse Rate  86     Resp 16     Temp 98.6 F (37 C)     Temp Source Oral     SpO2 97 %     Weight      Height      Head Circumference      Peak Flow      Pain Score 0     Pain Loc      Pain Edu?      Excl. in [place]?    No data found.  Updated Vital Signs BP 126/75 (BP Location: Left Arm)   Pulse 86   Temp 98.6 F (37 C) (Oral)   Resp 16   SpO2 97%   Visual Acuity Right Eye Distance:   Left Eye Distance:   Bilateral Distance:    Right Eye Near:   Left Eye Near:    Bilateral Near:     Physical Exam Constitutional:      Appearance: Normal appearance.  HENT:     Right Ear: Ear canal normal. Tympanic membrane is injected.     Left Ear: Ear canal normal. Tympanic membrane is injected.     Mouth/Throat:     Mouth: Mucous membranes are moist.     Pharynx: Posterior oropharyngeal erythema present. No pharyngeal swelling or oropharyngeal exudate.  Cardiovascular:     Rate and Rhythm: Normal rate and regular rhythm.     Heart sounds: Normal heart sounds.  Pulmonary:     Effort: Pulmonary effort is normal.     Breath sounds:  Normal breath sounds and air entry. No wheezing, rhonchi or rales.  Lymphadenopathy:     Cervical: No cervical adenopathy.  Neurological:     Mental Status: She is alert.      UC Treatments / Results  Labs (all labs ordered are listed, but only abnormal results are displayed) Labs Reviewed  POCT RAPID STREP A (OFFICE)    EKG   Radiology No results found.  Procedures Procedures (including critical care time)  Medications Ordered in UC Medications - No data to display  Initial Impression / Assessment and Plan / UC Course  I have reviewed the triage vital signs and the nursing notes.  Pertinent labs & imaging results that were available during my care of the patient were reviewed by me and considered in my medical decision making (see chart for details).    Plan: 1.The upper respiratory tract infection will be treated with the following: A.  Advised to continue taking Mucinex sinus max to help control cough and congestion. 2.  The fever will be to do with the following: A.  Advised Tylenol or ibuprofen to help control fever and discomfort. 3.  The sore throat will be treated with the following: A.  Ibuprofen or Tylenol along with lozenges to help soothe the throat. 4.  Advised follow-up PCP or return to urgent care if symptoms fail to improve. Final Clinical Impressions(s) / UC Diagnoses   Final diagnoses:  Fever, unspecified  Sore throat  Acute upper respiratory infection     Discharge Instructions      Advised to continue taking Mucinex sinus max to control the cough and congestion. Advised take Tylenol or ibuprofen for fever or body discomfort. Since his symptoms have improved over the past 24 hours and the fever has resolved advised to continue to treat the symptoms as they emerge as this is more of a viral type infection. Has follow-up PCP return to urgent care as needed.  ED Prescriptions   None    PDMP not reviewed this encounter.   Nyoka Lint, PA-C 03/03/22 [phone]

## 2022-11-30 ENCOUNTER — Other Ambulatory Visit: Payer: Self-pay

## 2022-11-30 DIAGNOSIS — Z1231 Encounter for screening mammogram for malignant neoplasm of breast: Secondary | ICD-10-CM

## 2022-12-23 ENCOUNTER — Ambulatory Visit
Admission: RE | Admit: 2022-12-23 | Discharge: 2022-12-23 | Disposition: A | Discharge status: Home / Self Care | Payer: No Typology Code available for payment source | Source: Ambulatory Visit

## 2022-12-23 DIAGNOSIS — Z1231 Encounter for screening mammogram for malignant neoplasm of breast: Secondary | ICD-10-CM

## 2024-01-22 IMAGING — MG MM DIGITAL SCREENING BILAT W/ TOMO AND CAD
8 series · 8 of 24 positions shown · non-contrast
Comparison: Previous exam(s).

CLINICAL DATA: Screening.

EXAM:
DIGITAL SCREENING BILATERAL MAMMOGRAM WITH TOMOSYNTHESIS AND CAD
TECHNIQUE: Bilateral screening digital craniocaudal and mediolateral oblique
mammograms were obtained. Bilateral screening digital breast
tomosynthesis was performed. The images were evaluated with
computer-aided detection.

[R CC synth-2D]
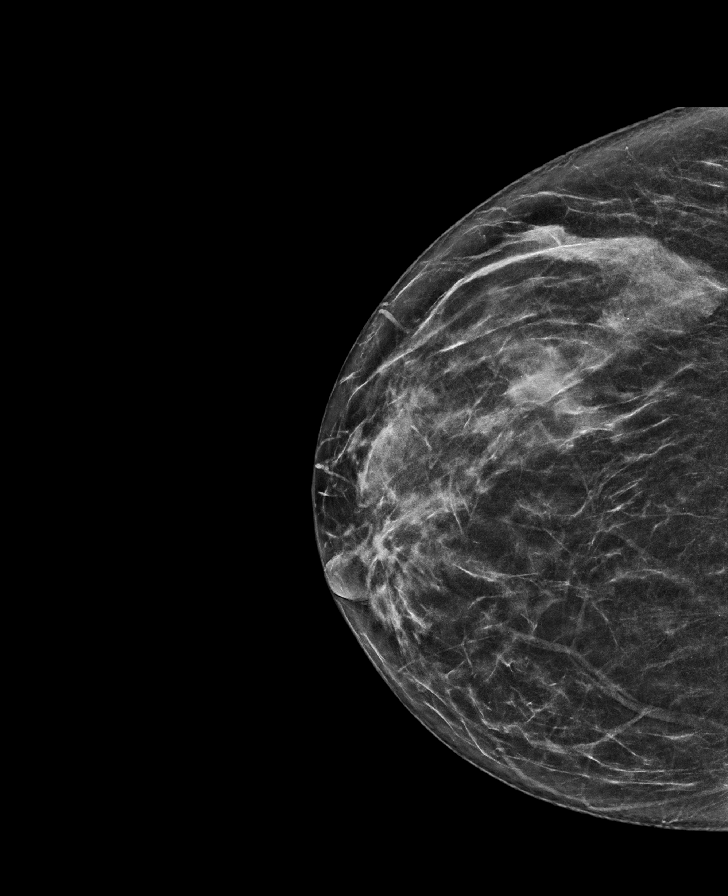

[L CC synth-2D]
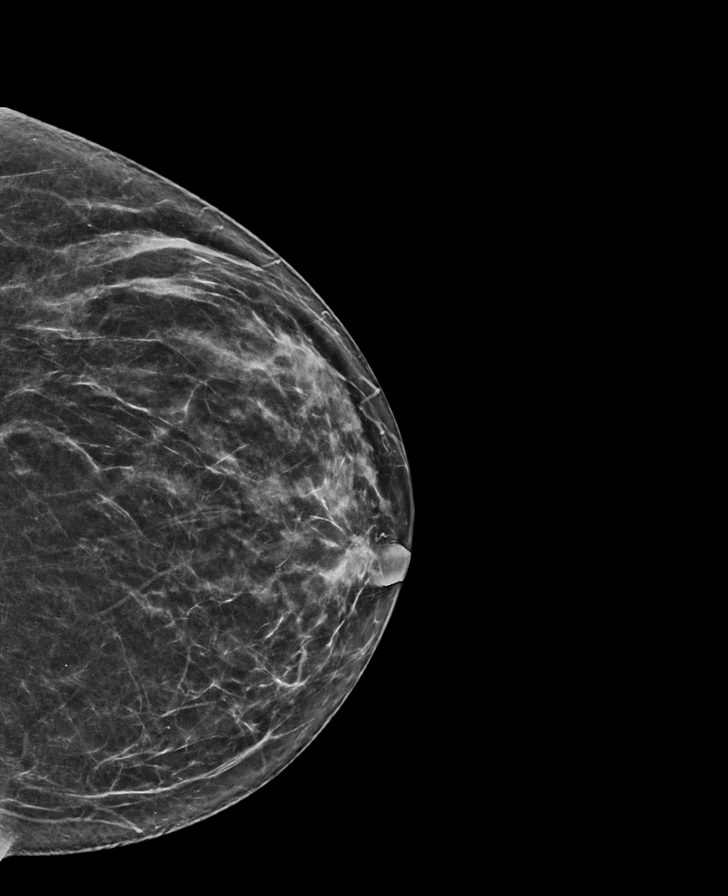

[L MLO synth-2D]
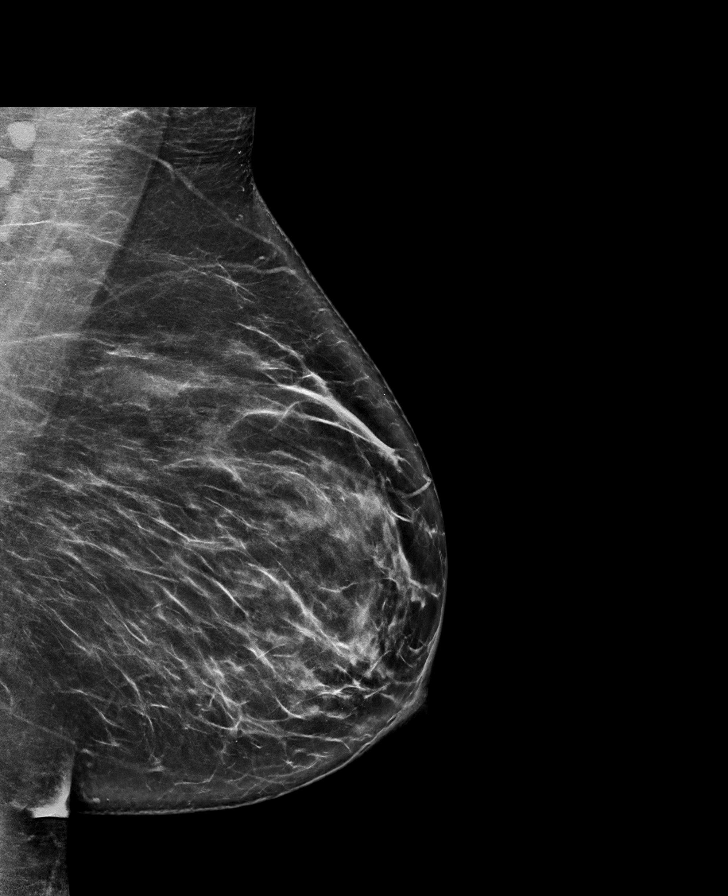

[R MLO synth-2D]
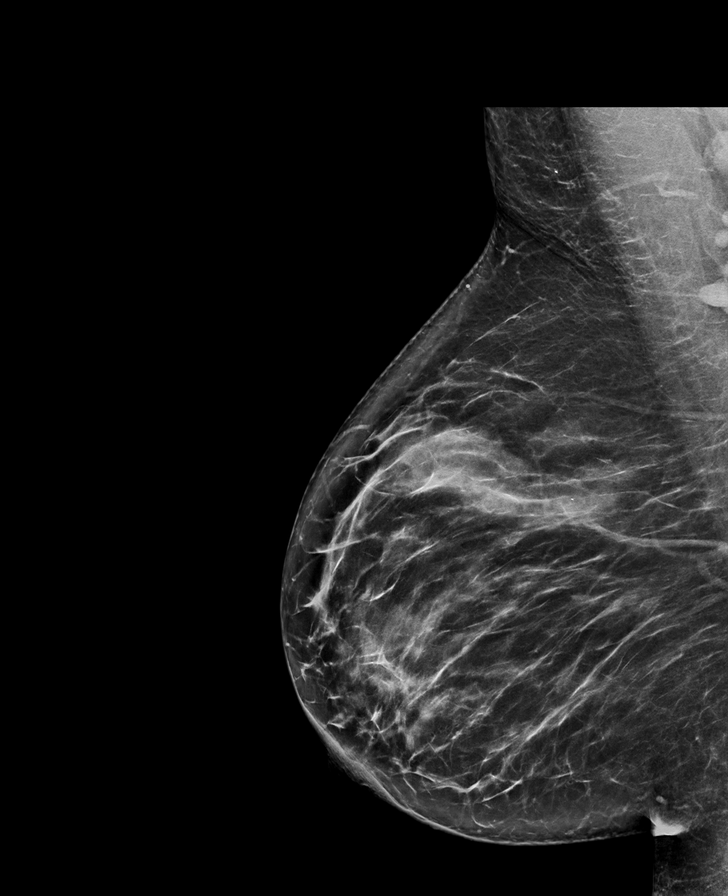

[R MLO tomo · tomo slice 43/86.0]
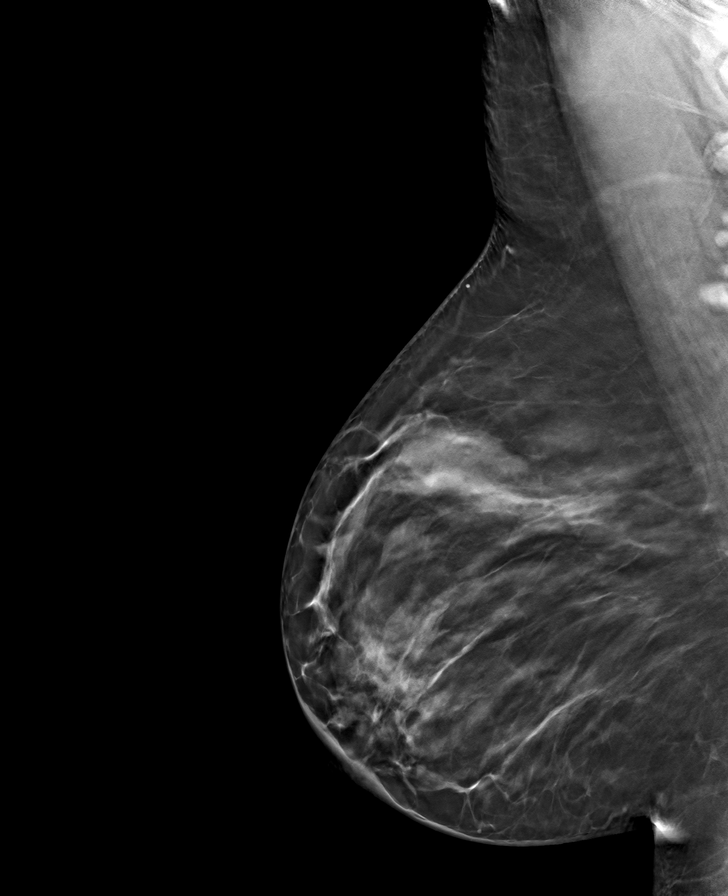

[L CC tomo · tomo slice 35/68.0]
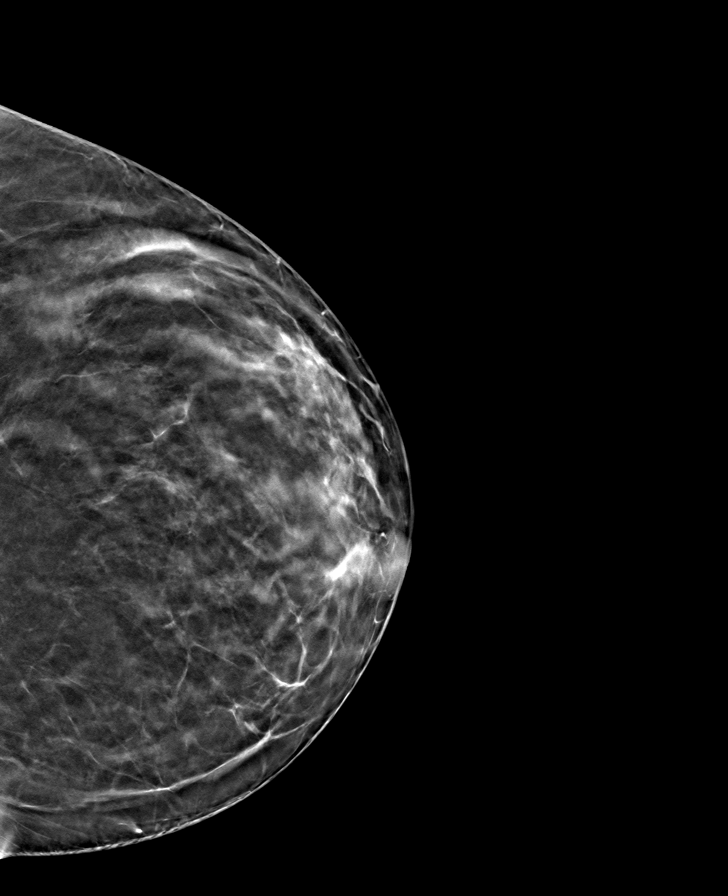

[L MLO tomo · tomo slice 42/83.0]
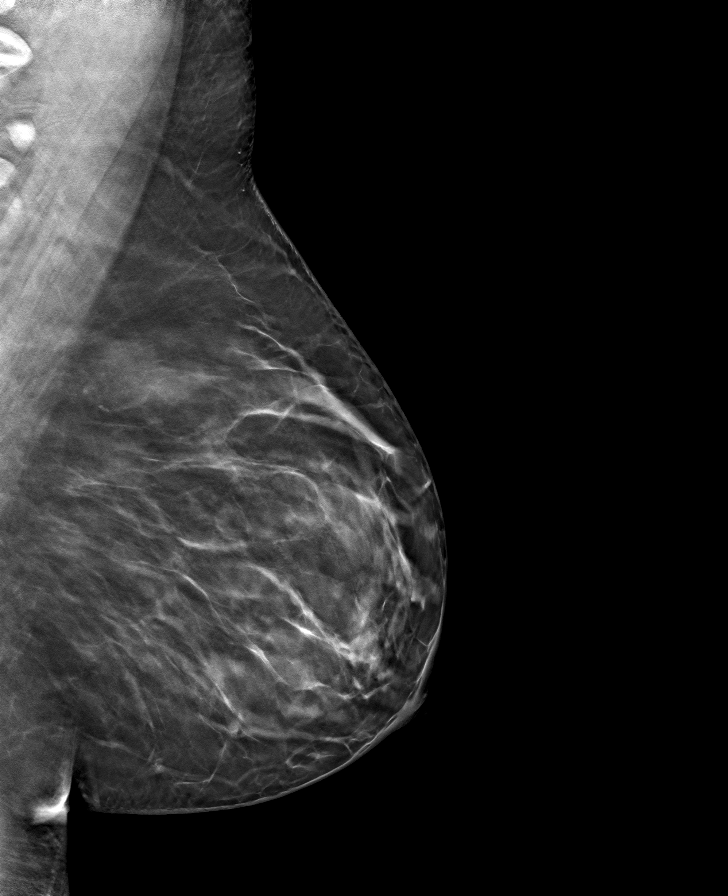

[R CC tomo · tomo slice 33/65.0]
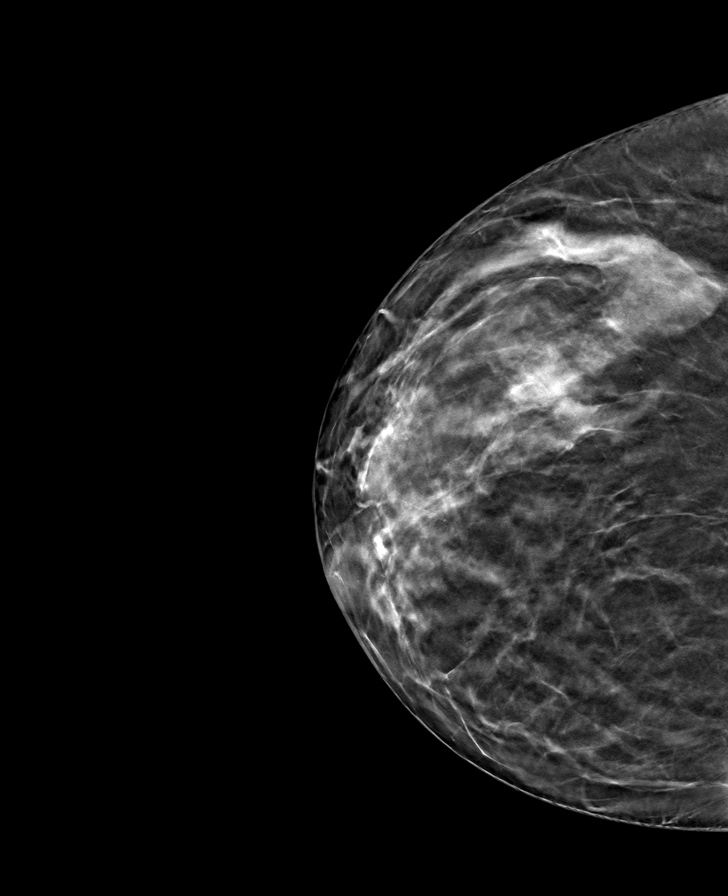

[8 of 24 positions shown; findings below may reference images not displayed]

ACR Breast Density Category b: There are scattered areas of
fibroglandular density.
FINDINGS: There are no findings suspicious for malignancy.
IMPRESSION: No mammographic evidence of malignancy. A result letter of this
screening mammogram will be mailed directly to the patient.

RECOMMENDATION:
Screening mammogram in one year. (Code:51-O-LD2)

BI-RADS CATEGORY  1: Negative.

## 2024-01-31 ENCOUNTER — Other Ambulatory Visit: Payer: Self-pay | Admitting: Physician Assistant

## 2024-01-31 DIAGNOSIS — Z1231 Encounter for screening mammogram for malignant neoplasm of breast: Secondary | ICD-10-CM

## 2024-03-09 ENCOUNTER — Ambulatory Visit
Admission: RE | Admit: 2024-03-09 | Discharge: 2024-03-09 | Disposition: A | Source: Ambulatory Visit | Attending: Physician Assistant | Admitting: Physician Assistant

## 2024-03-09 DIAGNOSIS — Z1231 Encounter for screening mammogram for malignant neoplasm of breast: Secondary | ICD-10-CM
